# Patient Record
Sex: Female | Born: 1965 | Race: White | Hispanic: No | Marital: Single
Health system: Southern US, Community
[De-identification: ages and names within clinical notes are randomized; demographics above are authoritative.]

---

## 2001-04-12 ENCOUNTER — Encounter: Payer: Self-pay | Admitting: Obstetrics and Gynecology

## 2001-04-12 ENCOUNTER — Inpatient Hospital Stay (HOSPITAL_COMMUNITY): Admission: AD | Admit: 2001-04-12 | Discharge: 2001-04-12 | Payer: Self-pay | Admitting: Obstetrics and Gynecology

## 2001-05-04 ENCOUNTER — Other Ambulatory Visit: Admission: RE | Admit: 2001-05-04 | Discharge: 2001-05-04 | Payer: Self-pay | Admitting: Obstetrics and Gynecology

## 2001-06-14 ENCOUNTER — Encounter: Payer: Self-pay | Admitting: Obstetrics and Gynecology

## 2001-06-14 ENCOUNTER — Ambulatory Visit (HOSPITAL_COMMUNITY): Admission: RE | Admit: 2001-06-14 | Discharge: 2001-06-14 | Payer: Self-pay | Admitting: Obstetrics and Gynecology

## 2001-11-20 ENCOUNTER — Inpatient Hospital Stay (HOSPITAL_COMMUNITY): Admission: AD | Admit: 2001-11-20 | Discharge: 2001-11-20 | Payer: Self-pay | Admitting: Obstetrics and Gynecology

## 2001-11-21 ENCOUNTER — Inpatient Hospital Stay (HOSPITAL_COMMUNITY): Admission: AD | Admit: 2001-11-21 | Discharge: 2001-11-21 | Payer: Self-pay | Admitting: Obstetrics and Gynecology

## 2001-11-22 ENCOUNTER — Inpatient Hospital Stay (HOSPITAL_COMMUNITY): Admission: AD | Admit: 2001-11-22 | Discharge: 2001-11-24 | Payer: Self-pay | Admitting: Obstetrics and Gynecology

## 2001-11-22 ENCOUNTER — Encounter (INDEPENDENT_AMBULATORY_CARE_PROVIDER_SITE_OTHER): Payer: Self-pay

## 2002-01-05 ENCOUNTER — Encounter: Payer: Self-pay | Admitting: Emergency Medicine

## 2002-01-05 ENCOUNTER — Emergency Department (HOSPITAL_COMMUNITY): Admission: EM | Admit: 2002-01-05 | Discharge: 2002-01-05 | Payer: Self-pay | Admitting: Emergency Medicine

## 2009-10-03 ENCOUNTER — Encounter: Admission: RE | Admit: 2009-10-03 | Discharge: 2009-10-03 | Payer: Self-pay | Admitting: Obstetrics and Gynecology
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
## 2010-11-08 NOTE — Op Note (Signed)
[facility]  Patient:    [name], Paige Whitney Visit Number: [id number] MRN: [id number]          Service Type: OBS Location: 9300 [id number] Attending Physician:  Leonard Schwartz Dictated by:   Maris Berger. Pennie Rushing, M.D. Proc. Date: 11/23/01 Admit Date:  11/22/2001 Discharge Date: 11/24/2001                             Operative Report  PREOPERATIVE DIAGNOSES:       Desire for surgical sterilization.  POSTOPERATIVE DIAGNOSES:      Desire for surgical sterilization.  OPERATION:                    Postpartum tubal ligation.  ANESTHESIA:                   Epidural.  ESTIMATED BLOOD LOSS:         Less than 10 cc.  COMPLICATIONS:                None.  FINDINGS:                     The left tube and ovary appeared normal for the gravid state.  The right tube had a small peritubal cyst.  PREOPERATIVE DISCUSSION:      A discussion was held with the patient concerning her indication for her procedure which is her desire for surgical sterilization as well as the risks involved which include, but are not limited to, anesthesia, bleeding, infection, damage to adjacent organs, and a small risk of failure of the tubal sterilization resulting in subsequent pregnancy. She seemed to understand, had her questions answered, and wished to proceed.  PROCEDURE:                    Patient was taken to the operating room after appropriate identification and placed on the operating table.  Her labor epidural was in place and was dosed for surgical anesthesia.  The abdomen was scrubbed with multiple layers of Betadine and draped as a sterile field.  The subumbilical area was infiltrated with 10 cc of 0.25% Marcaine.  A subumbilical incision was made and the peritoneum entered.  The left fallopian tube was identified, followed to its fimbriated end, and grasped at the isthmic portion and elevated.  A suture of 2-0 chromic was placed through the mesosalpinx and tied  fore and aft on the knuckle of tube.  A second ligature was placed proximal to that and the intervening knuckle of tube excised.  The cut ends were cauterized.  A similar procedure was carried out on the opposite side.  Hemostasis was noted to be adequate.  The abdominoperitoneum was closed with a running suture of 0 Vicryl.  The rectus fascia was closed with a running suture of 0 Vicryl.  Subcuticular sutures of 3-0 Vicryl were used to close the skin incision.  Sterile dressing was applied.  Patient was taken from the operating room to the recovery room in satisfactory condition having tolerated the procedure well with sponge and instrument counts correct. Dictated by:   Maris Berger. Pennie Rushing, M.D. Attending Physician:  Leonard Schwartz DD:  11/23/01 TD:  11/25/01 Job: [id number]

## 2010-11-08 NOTE — Discharge Summary (Signed)
[facility]  Patient:    Paige Whitney, MORGENTHALER Visit Number: [id number] MRN: [id number]          Service Type: OBS Location: 9300 [id number] Attending Physician:  Leonard Schwartz Dictated by:   Mack Guise, C.N.M. Admit Date:  11/22/2001 Discharge Date: 11/24/2001                             Discharge Summary  ADMITTING DIAGNOSES:             1. Intrauterine pregnancy at term.                                  2. Early active labor.                                  3. Desires sterilization.  PROCEDURES:                      1. Normal spontaneous vaginal delivery.                                  2. Repair of right labial and first-degree                                     vaginal laceration.                                  3. Postpartum bilateral tubal sterilization.  DISCHARGE DIAGNOSES:             1. Intrauterine pregnancy at term.                                  2. Early active labor.                                  3. Desires sterilization.                                  4. Normal spontaneous vaginal delivery.                                  5. Repair of right labial and first-degree                                     vaginal laceration.                                  6. Postpartum bilateral tubal sterilization.  HISTORY AND HOSPITAL COURSE:     The patient is a 45 year old gravida 4, para 2-0-1-2 who presented at term in early active labor.  Her labor progressed normally and she went  on to a normal spontaneous vaginal delivery with the birth of an 8-pound 11-ounce female infant name Mason with Apgar scores of 8 at one minute, 9 at five minutes.  The patient has done well in the immediate postpartum period.  Her hemoglobin on the first postpartum day was 11.0.  She underwent postpartum bilateral tubal sterilization by Dr. Dierdre Forth and on this her second postpartum day/first postop day she is judged to be in satisfactory  condition for discharge.  DISCHARGE INSTRUCTIONS:          Per [facility] handout.  DISCHARGE MEDICATIONS:           1. Motrin 600 mg p.o. q.6h. p.r.n. pain.                                  2. Tylox one to two p.o. q.3-4h. pain.                                  3. Prenatal vitamins. Dictated by:   Mack Guise, C.N.M. Attending Physician:  Leonard Schwartz DD:  11/24/01 TD:  11/25/01 Job: [phone]

## 2016-09-11 ENCOUNTER — Ambulatory Visit (HOSPITAL_COMMUNITY)
Admission: EM | Admit: 2016-09-11 | Discharge: 2016-09-11 | Disposition: A | Discharge status: Home / Self Care | Payer: Self-pay | Attending: Family Medicine | Admitting: Family Medicine

## 2016-09-11 ENCOUNTER — Encounter (HOSPITAL_COMMUNITY): Payer: Self-pay | Admitting: Emergency Medicine

## 2016-09-11 DIAGNOSIS — H6123 Impacted cerumen, bilateral: Secondary | ICD-10-CM

## 2016-09-11 NOTE — ED Notes (Signed)
PT does not wish to see a provider for this visit. Large amount of wax removed bilaterally. Ear drums visible with no redness. PT has no questions or requests at discharge.

## 2016-09-11 NOTE — ED Triage Notes (Signed)
Both ears are clogged with wax.

## 2019-05-28 ENCOUNTER — Inpatient Hospital Stay (HOSPITAL_COMMUNITY): Payer: Medicaid Other

## 2019-05-28 ENCOUNTER — Emergency Department (HOSPITAL_COMMUNITY): Payer: Medicaid Other

## 2019-05-28 ENCOUNTER — Other Ambulatory Visit: Payer: Self-pay

## 2019-05-28 ENCOUNTER — Inpatient Hospital Stay (HOSPITAL_COMMUNITY)
Admission: EM | Admit: 2019-05-28 | Discharge: 2019-05-30 | DRG: 917 | Payer: Medicaid Other | Attending: Pulmonary Disease | Admitting: Pulmonary Disease

## 2019-05-28 ENCOUNTER — Encounter (HOSPITAL_COMMUNITY): Payer: Self-pay | Admitting: Radiology

## 2019-05-28 ENCOUNTER — Other Ambulatory Visit (HOSPITAL_COMMUNITY): Payer: Medicaid Other

## 2019-05-28 DIAGNOSIS — F1990 Other psychoactive substance use, unspecified, uncomplicated: Secondary | ICD-10-CM

## 2019-05-28 DIAGNOSIS — R739 Hyperglycemia, unspecified: Secondary | ICD-10-CM | POA: Diagnosis present

## 2019-05-28 DIAGNOSIS — I609 Nontraumatic subarachnoid hemorrhage, unspecified: Secondary | ICD-10-CM

## 2019-05-28 DIAGNOSIS — E872 Acidosis, unspecified: Secondary | ICD-10-CM | POA: Diagnosis present

## 2019-05-28 DIAGNOSIS — Z5289 Donor of other specified organs or tissues: Secondary | ICD-10-CM

## 2019-05-28 DIAGNOSIS — I468 Cardiac arrest due to other underlying condition: Secondary | ICD-10-CM | POA: Diagnosis present

## 2019-05-28 DIAGNOSIS — G9382 Brain death: Secondary | ICD-10-CM | POA: Diagnosis present

## 2019-05-28 DIAGNOSIS — R402212 Coma scale, best verbal response, none, at arrival to emergency department: Secondary | ICD-10-CM | POA: Diagnosis present

## 2019-05-28 DIAGNOSIS — T405X1A Poisoning by cocaine, accidental (unintentional), initial encounter: Secondary | ICD-10-CM | POA: Diagnosis present

## 2019-05-28 DIAGNOSIS — F1410 Cocaine abuse, uncomplicated: Secondary | ICD-10-CM | POA: Diagnosis present

## 2019-05-28 DIAGNOSIS — I959 Hypotension, unspecified: Secondary | ICD-10-CM | POA: Diagnosis present

## 2019-05-28 DIAGNOSIS — I469 Cardiac arrest, cause unspecified: Secondary | ICD-10-CM | POA: Diagnosis present

## 2019-05-28 DIAGNOSIS — T41291A Poisoning by other general anesthetics, accidental (unintentional), initial encounter: Secondary | ICD-10-CM | POA: Diagnosis present

## 2019-05-28 DIAGNOSIS — R402312 Coma scale, best motor response, none, at arrival to emergency department: Secondary | ICD-10-CM | POA: Diagnosis present

## 2019-05-28 DIAGNOSIS — Z789 Other specified health status: Secondary | ICD-10-CM

## 2019-05-28 DIAGNOSIS — R579 Shock, unspecified: Secondary | ICD-10-CM | POA: Diagnosis present

## 2019-05-28 DIAGNOSIS — R402112 Coma scale, eyes open, never, at arrival to emergency department: Secondary | ICD-10-CM | POA: Diagnosis present

## 2019-05-28 DIAGNOSIS — R4020 Unspecified coma: Secondary | ICD-10-CM | POA: Diagnosis present

## 2019-05-28 DIAGNOSIS — J9600 Acute respiratory failure, unspecified whether with hypoxia or hypercapnia: Secondary | ICD-10-CM | POA: Diagnosis present

## 2019-05-28 DIAGNOSIS — Z20828 Contact with and (suspected) exposure to other viral communicable diseases: Secondary | ICD-10-CM | POA: Diagnosis present

## 2019-05-28 DIAGNOSIS — R402432 Glasgow coma scale score 3-8, at arrival to emergency department: Secondary | ICD-10-CM

## 2019-05-28 DIAGNOSIS — Z005 Encounter for examination of potential donor of organ and tissue: Secondary | ICD-10-CM

## 2019-05-28 LAB — CBG MONITORING, ED: Glucose-Capillary: 285 mg/dL — ABNORMAL HIGH (ref 70–99)

## 2019-05-28 LAB — I-STAT CHEM 8, ED
BUN: 11 mg/dL (ref 6–20)
Calcium, Ion: 1.04 mmol/L — ABNORMAL LOW (ref 1.15–1.40)
Chloride: 105 mmol/L (ref 98–111)
Creatinine, Ser: 0.7 mg/dL (ref 0.44–1.00)
Glucose, Bld: 283 mg/dL — ABNORMAL HIGH (ref 70–99)
HCT: 35 % — ABNORMAL LOW (ref 36.0–46.0)
Hemoglobin: 11.9 g/dL — ABNORMAL LOW (ref 12.0–15.0)
Potassium: 3.5 mmol/L (ref 3.5–5.1)
Sodium: 139 mmol/L (ref 135–145)
TCO2: 19 mmol/L — ABNORMAL LOW (ref 22–32)

## 2019-05-28 LAB — CBC WITH DIFFERENTIAL/PLATELET
Abs Immature Granulocytes: 0.38 10*3/uL — ABNORMAL HIGH (ref 0.00–0.07)
Basophils Absolute: 0.1 10*3/uL (ref 0.0–0.1)
Basophils Relative: 1 %
Eosinophils Absolute: 0 10*3/uL (ref 0.0–0.5)
Eosinophils Relative: 0 %
HCT: 43.2 % (ref 36.0–46.0)
Hemoglobin: 13.1 g/dL (ref 12.0–15.0)
Immature Granulocytes: 2 %
Lymphocytes Relative: 15 %
Lymphs Abs: 2.5 10*3/uL (ref 0.7–4.0)
MCH: 28.8 pg (ref 26.0–34.0)
MCHC: 30.3 g/dL (ref 30.0–36.0)
MCV: 94.9 fL (ref 80.0–100.0)
Monocytes Absolute: 0.8 10*3/uL (ref 0.1–1.0)
Monocytes Relative: 5 %
Neutro Abs: 13.1 10*3/uL — ABNORMAL HIGH (ref 1.7–7.7)
Neutrophils Relative %: 77 %
Platelets: 250 10*3/uL (ref 150–400)
RBC: 4.55 MIL/uL (ref 3.87–5.11)
RDW: 17.2 % — ABNORMAL HIGH (ref 11.5–15.5)
WBC: 17 10*3/uL — ABNORMAL HIGH (ref 4.0–10.5)
nRBC: 0.2 % (ref 0.0–0.2)

## 2019-05-28 LAB — COMPREHENSIVE METABOLIC PANEL
ALT: 40 U/L (ref 0–44)
AST: 42 U/L — ABNORMAL HIGH (ref 15–41)
Albumin: 3.8 g/dL (ref 3.5–5.0)
Alkaline Phosphatase: 67 U/L (ref 38–126)
Anion gap: 20 — ABNORMAL HIGH (ref 5–15)
BUN: 12 mg/dL (ref 6–20)
CO2: 18 mmol/L — ABNORMAL LOW (ref 22–32)
Calcium: 8.9 mg/dL (ref 8.9–10.3)
Chloride: 101 mmol/L (ref 98–111)
Creatinine, Ser: 0.95 mg/dL (ref 0.44–1.00)
GFR calc Af Amer: >60 mL/min (ref >60)
GFR calc non Af Amer: >60 mL/min (ref >60)
Glucose, Bld: 363 mg/dL — ABNORMAL HIGH (ref 70–99)
Potassium: 3.4 mmol/L — ABNORMAL LOW (ref 3.5–5.1)
Sodium: 139 mmol/L (ref 135–145)
Total Bilirubin: 0.4 mg/dL (ref 0.3–1.2)
Total Protein: 6.7 g/dL (ref 6.5–8.1)

## 2019-05-28 LAB — I-STAT BETA HCG BLOOD, ED (MC, WL, AP ONLY): I-stat hCG, quantitative: <5 m[IU]/mL (ref <5)

## 2019-05-28 LAB — POCT I-STAT 7, (LYTES, BLD GAS, ICA,H+H)
Acid-base deficit: 5 mmol/L — ABNORMAL HIGH (ref 0.0–2.0)
Bicarbonate: 16.2 mmol/L — ABNORMAL LOW (ref 20.0–28.0)
Calcium, Ion: 1.18 mmol/L (ref 1.15–1.40)
HCT: 38 % (ref 36.0–46.0)
Hemoglobin: 12.9 g/dL (ref 12.0–15.0)
O2 Saturation: 100 %
Patient temperature: 32.9
Potassium: 3.8 mmol/L (ref 3.5–5.1)
Sodium: 145 mmol/L (ref 135–145)
TCO2: 17 mmol/L — ABNORMAL LOW (ref 22–32)
pCO2 arterial: 17 mmHg — CL (ref 32.0–48.0)
pH, Arterial: 7.573 — ABNORMAL HIGH (ref 7.350–7.450)
pO2, Arterial: 168 mmHg — ABNORMAL HIGH (ref 83.0–108.0)

## 2019-05-28 LAB — ACETAMINOPHEN LEVEL: Acetaminophen (Tylenol), Serum: <10 ug/mL — ABNORMAL LOW (ref 10–30)

## 2019-05-28 LAB — BLOOD GAS, ARTERIAL
Acid-base deficit: 10.5 mmol/L — ABNORMAL HIGH (ref 0.0–2.0)
Acid-base deficit: 4.4 mmol/L — ABNORMAL HIGH (ref 0.0–2.0)
Bicarbonate: 15.5 mmol/L — ABNORMAL LOW (ref 20.0–28.0)
Bicarbonate: 18.1 mmol/L — ABNORMAL LOW (ref 20.0–28.0)
Drawn by: 331471
Drawn by: 44007
FIO2: 100
FIO2: 40
MECHVT: 510 mL
O2 Saturation: 99.2 %
O2 Saturation: 97.9 %
PEEP: 5 cmH2O
Patient temperature: 93.7
Patient temperature: 37
RATE: 15 resp/min
pCO2 arterial: 31.6 mmHg — ABNORMAL LOW (ref 32.0–48.0)
pCO2 arterial: 22.3 mmHg — ABNORMAL LOW (ref 32.0–48.0)
pH, Arterial: 7.292 — ABNORMAL LOW (ref 7.350–7.450)
pH, Arterial: 7.52 — ABNORMAL HIGH (ref 7.350–7.450)
pO2, Arterial: 416 mmHg — ABNORMAL HIGH (ref 83.0–108.0)
pO2, Arterial: 204 mmHg — ABNORMAL HIGH (ref 83.0–108.0)

## 2019-05-28 LAB — ETHANOL: Alcohol, Ethyl (B): <10 mg/dL (ref <10)

## 2019-05-28 LAB — RAPID URINE DRUG SCREEN, HOSP PERFORMED
Amphetamines: NOT DETECTED
Barbiturates: NOT DETECTED
Benzodiazepines: NOT DETECTED
Cocaine: POSITIVE — AB
Opiates: NOT DETECTED
Tetrahydrocannabinol: POSITIVE — AB

## 2019-05-28 LAB — GLUCOSE, CAPILLARY: Glucose-Capillary: 163 mg/dL — ABNORMAL HIGH (ref 70–99)

## 2019-05-28 LAB — MRSA PCR SCREENING: MRSA by PCR: NEGATIVE

## 2019-05-28 LAB — URINALYSIS, ROUTINE W REFLEX MICROSCOPIC
Bacteria, UA: NONE SEEN
Bilirubin Urine: NEGATIVE
Glucose, UA: >=500 mg/dL — AB
Ketones, ur: 5 mg/dL — AB
Leukocytes,Ua: NEGATIVE
Nitrite: NEGATIVE
Protein, ur: 100 mg/dL — AB
Specific Gravity, Urine: 1.007 (ref 1.005–1.030)
pH: 7 (ref 5.0–8.0)

## 2019-05-28 LAB — SARS CORONAVIRUS 2 BY RT PCR (HOSPITAL ORDER, PERFORMED IN ~~LOC~~ HOSPITAL LAB): SARS Coronavirus 2: NEGATIVE

## 2019-05-28 LAB — PROTIME-INR
INR: 1.2 (ref 0.8–1.2)
Prothrombin Time: 15 seconds (ref 11.4–15.2)

## 2019-05-28 LAB — PROCALCITONIN: Procalcitonin: 0.4 ng/mL

## 2019-05-28 LAB — LACTIC ACID, PLASMA: Lactic Acid, Venous: 6.9 mmol/L (ref 0.5–1.9)

## 2019-05-28 LAB — SALICYLATE LEVEL: Salicylate Lvl: <7 mg/dL (ref 2.8–30.0)

## 2019-05-28 LAB — APTT: aPTT: 42 seconds — ABNORMAL HIGH (ref 24–36)

## 2019-05-28 LAB — HIV ANTIBODY (ROUTINE TESTING W REFLEX): HIV Screen 4th Generation wRfx: NONREACTIVE

## 2019-05-28 MED ORDER — LACTATED RINGERS IV BOLUS (SEPSIS)
1000.0000 mL | Freq: Once | INTRAVENOUS | Status: AC
  Administered 2019-05-28: 1000 mL via INTRAVENOUS

## 2019-05-28 MED ORDER — SODIUM CHLORIDE 0.9 % IV SOLN
3.0000 g | Freq: Four times a day (QID) | INTRAVENOUS | Status: DC
  Administered 2019-05-28: 3 g via INTRAVENOUS
  Filled 2019-05-28 (×4): qty 8

## 2019-05-28 MED ORDER — CHLORHEXIDINE GLUCONATE 0.12% ORAL RINSE (MEDLINE KIT)
15.0000 mL | Freq: Two times a day (BID) | OROMUCOSAL | Status: DC
  Administered 2019-05-28 – 2019-05-30 (×4): 15 mL via OROMUCOSAL

## 2019-05-28 MED ORDER — LACTATED RINGERS IV SOLN
Freq: Once | INTRAVENOUS | Status: AC
  Administered 2019-05-29: via INTRAVENOUS

## 2019-05-28 MED ORDER — CHLORHEXIDINE GLUCONATE CLOTH 2 % EX PADS
6.0000 | MEDICATED_PAD | Freq: Every day | CUTANEOUS | Status: DC
  Administered 2019-05-30: 6 via TOPICAL

## 2019-05-28 MED ORDER — NALOXONE HCL 2 MG/2ML IJ SOSY
PREFILLED_SYRINGE | INTRAMUSCULAR | Status: AC
  Administered 2019-05-28: 2 mg
  Filled 2019-05-28: qty 2

## 2019-05-28 MED ORDER — ORAL CARE MOUTH RINSE: 15.0000 mL | OROMUCOSAL | Status: DC

## 2019-05-28 MED ORDER — CHLORHEXIDINE GLUCONATE 0.12% ORAL RINSE (MEDLINE KIT): 15.0000 mL | Freq: Two times a day (BID) | OROMUCOSAL | Status: DC

## 2019-05-28 MED ORDER — PANTOPRAZOLE SODIUM 40 MG IV SOLR: 40.0000 mg | Freq: Every day | INTRAVENOUS | Status: DC

## 2019-05-28 MED ORDER — MIDAZOLAM HCL 2 MG/2ML IJ SOLN: 2.0000 mg | INTRAMUSCULAR | Status: DC | PRN

## 2019-05-28 MED ORDER — INSULIN ASPART 100 UNIT/ML ~~LOC~~ SOLN: 2.0000 [IU] | SUBCUTANEOUS | Status: DC

## 2019-05-28 MED ORDER — IOHEXOL 350 MG/ML SOLN
100.0000 mL | Freq: Once | INTRAVENOUS | Status: AC | PRN
  Administered 2019-05-28: 100 mL via INTRAVENOUS

## 2019-05-28 MED ORDER — EPINEPHRINE 1 MG/10ML IJ SOSY
PREFILLED_SYRINGE | INTRAMUSCULAR | Status: AC | PRN
  Administered 2019-05-28 (×2): 1 mg via INTRAVENOUS

## 2019-05-28 MED ORDER — INSULIN ASPART 100 UNIT/ML ~~LOC~~ SOLN
2.0000 [IU] | SUBCUTANEOUS | Status: DC
  Administered 2019-05-28: 4 [IU] via SUBCUTANEOUS

## 2019-05-28 MED ORDER — FENTANYL CITRATE (PF) 100 MCG/2ML IJ SOLN: 50.0000 ug | INTRAMUSCULAR | Status: DC | PRN

## 2019-05-28 MED ORDER — VANCOMYCIN HCL IN DEXTROSE 1-5 GM/200ML-% IV SOLN
1000.0000 mg | INTRAVENOUS | Status: AC
  Administered 2019-05-29: 1000 mg via INTRAVENOUS
  Filled 2019-05-28: qty 200

## 2019-05-28 MED ORDER — NOREPINEPHRINE 4 MG/250ML-% IV SOLN
0.0000 ug/min | INTRAVENOUS | Status: DC
  Administered 2019-05-28: 8 ug/min via INTRAVENOUS
  Administered 2019-05-28: 2 ug/min via INTRAVENOUS
  Administered 2019-05-29: 18 ug/min via INTRAVENOUS
  Filled 2019-05-28 (×4): qty 250

## 2019-05-28 MED ORDER — TECHNETIUM TC 99M EXAMETAZIME IV KIT
18.5000 | PACK | Freq: Once | INTRAVENOUS | Status: AC | PRN
  Administered 2019-05-28: 18.5 via INTRAVENOUS

## 2019-05-28 MED ORDER — ORAL CARE MOUTH RINSE
15.0000 mL | OROMUCOSAL | Status: DC
  Administered 2019-05-28 (×3): 15 mL via OROMUCOSAL

## 2019-05-28 MED ORDER — SODIUM CHLORIDE 0.9 % IV SOLN
1000.0000 mg | INTRAVENOUS | Status: AC
  Administered 2019-05-29: 1000 mg via INTRAVENOUS
  Filled 2019-05-28: qty 8

## 2019-05-28 MED ORDER — SODIUM CHLORIDE 0.9 % IV SOLN
INTRAVENOUS | Status: DC
  Administered 2019-05-28: 15:00:00 via INTRAVENOUS

## 2019-05-28 MED ORDER — METRONIDAZOLE IN NACL 5-0.79 MG/ML-% IV SOLN
500.0000 mg | Freq: Once | INTRAVENOUS | Status: DC
  Filled 2019-05-28: qty 100

## 2019-05-28 MED ORDER — LACTATED RINGERS IV SOLN
INTRAVENOUS | Status: DC
  Administered 2019-05-29 – 2019-05-30 (×2): via INTRAVENOUS

## 2019-05-28 MED ORDER — VANCOMYCIN HCL IN DEXTROSE 1-5 GM/200ML-% IV SOLN: 1000.0000 mg | Freq: Once | INTRAVENOUS | Status: DC

## 2019-05-28 MED ORDER — CIPROFLOXACIN IN D5W 400 MG/200ML IV SOLN
400.0000 mg | INTRAVENOUS | Status: AC
  Administered 2019-05-29: 400 mg via INTRAVENOUS
  Filled 2019-05-28: qty 200

## 2019-05-28 MED ORDER — SODIUM CHLORIDE (PF) 0.9 % IJ SOLN
INTRAMUSCULAR | Status: AC
  Administered 2019-05-28: 12:00:00
  Filled 2019-05-28: qty 50

## 2019-05-28 MED ORDER — VANCOMYCIN HCL 10 G IV SOLR
1250.0000 mg | Freq: Once | INTRAVENOUS | Status: AC
  Administered 2019-05-28: 1250 mg via INTRAVENOUS
  Filled 2019-05-28: qty 1250

## 2019-05-28 MED ORDER — SODIUM CHLORIDE 0.9 % IV SOLN
2.0000 g | Freq: Once | INTRAVENOUS | Status: AC
  Administered 2019-05-28: 2 g via INTRAVENOUS
  Filled 2019-05-28: qty 2

## 2019-05-28 NOTE — Code Documentation (Addendum)
2 Narcan in
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
## 2019-05-28 NOTE — Code Documentation (Signed)
Compressions continued

## 2019-05-28 NOTE — ED Provider Notes (Signed)
[facility] DEPT Provider Note   CSN: [id number] Arrival date & time: 05/28/19  0818     History   Chief Complaint Chief Complaint  Patient presents with   Unresponsive    HPI Paige Whitney is a 53 y.o. female.     HPI   53 year old female with no significant medical history arrives to the emergency department with chief complaint of being unresponsive, found to be in PEA arrest on arrival.  Boyfriend, Marjory Lies, admits they had been using ketamine and cocaine throughout the night, and that she appeared to be walking stumbling, and speaking in gibberish, but it did not appear to be abnormal for them using ketamine just prior.  Reports that she then laid down and appeared to take 1 breath, and then appeared to have stopped breathing.  He immediately picked her up and put her in the car and drove her to the emergency department.  He estimates it was approximately 5-minute drive in the car, and that it was likely 5 minutes of lifting during getting her into the vehicle.  On arrival to the emergency department, she is unresponsive, apneic, and pulseless.  He denies any known medical problems, or other medical illness leading up to this event.  They have been dating since February.  He denies her appearing to have any focal numbness or weakness prior to becoming unresponsive, but does report that she was walking with an unsteady gait.  She is still legally married to her husband Rob and father of her children, although they have been separated for the last 10 years. They have a very good relationship and he is still her POA.    No past medical history on file.  Patient Active Problem List   Diagnosis Date Noted   Acute respiratory failure ([place]) 05/28/2019   Cardiopulmonary arrest ([place]) 05/28/2019   SAH (subarachnoid hemorrhage) ([place]) 05/28/2019   Coma ([place]) 05/28/2019   Shock ([place]) 05/28/2019   Lactic acidosis 05/28/2019    No past surgical history  on file.   OB History   No obstetric history on file.      Home Medications    Prior to Admission medications   Not on File    Family History No family history on file.  Social History Social History   Tobacco Use   Smoking status: Not on file  Substance Use Topics   Alcohol use: Not on file   Drug use: Not on file     Allergies   Erythromycin   Review of Systems Review of Systems  Unable to perform ROS: Patient unresponsive     Physical Exam Updated Vital Signs BP (!) 87/64    Pulse 76    Temp (!) 95.7 F (35.4 C)    Resp 20    Ht '5\' 8"'  (1.727 m)    Wt 56.3 kg    SpO2 100%    BMI 18.87 kg/m   Physical Exam Vitals signs and nursing note reviewed.  Constitutional:      General: She is not in acute distress.    Appearance: She is normal weight. She is ill-appearing and toxic-appearing. She is not diaphoretic.     Comments: Cyanotic, limp, unresponsive  HENT:     Head: Normocephalic and atraumatic.  Eyes:     Comments: Pupils 9m unresponsive  Cardiovascular:     Comments: Pulseless on arrival Pulmonary:     Comments: Apneic on arrival Chest rise BVM Abdominal:     General: There  is no distension.     Palpations: Abdomen is soft.  Musculoskeletal:        General: No tenderness.  Skin:    General: Skin is warm and dry.     Findings: No erythema or rash.  Neurological:     Mental Status: She is unresponsive.     GCS: GCS eye subscore is 1. GCS verbal subscore is 1. GCS motor subscore is 1.      ED Treatments / Results  Labs (all labs ordered are listed, but only abnormal results are displayed) Labs Reviewed  LACTIC ACID, PLASMA - Abnormal; Notable for the following components:      Result Value   Lactic Acid, Venous 6.9 (*)    All other components within normal limits  COMPREHENSIVE METABOLIC PANEL - Abnormal; Notable for the following components:   Potassium 3.4 (*)    CO2 18 (*)    Glucose, Bld 363 (*)    AST 42 (*)    Anion gap  20 (*)    All other components within normal limits  CBC WITH DIFFERENTIAL/PLATELET - Abnormal; Notable for the following components:   WBC 17.0 (*)    RDW 17.2 (*)    Neutro Abs 13.1 (*)    Abs Immature Granulocytes 0.38 (*)    All other components within normal limits  APTT - Abnormal; Notable for the following components:   aPTT 42 (*)    All other components within normal limits  URINALYSIS, ROUTINE W REFLEX MICROSCOPIC - Abnormal; Notable for the following components:   Color, Urine STRAW (*)    Glucose, UA >=500 (*)    Hgb urine dipstick SMALL (*)    Ketones, ur 5 (*)    Protein, ur 100 (*)    All other components within normal limits  RAPID URINE DRUG SCREEN, HOSP PERFORMED - Abnormal; Notable for the following components:   Cocaine POSITIVE (*)    Tetrahydrocannabinol POSITIVE (*)    All other components within normal limits  ACETAMINOPHEN LEVEL - Abnormal; Notable for the following components:   Acetaminophen (Tylenol), Serum <10 (*)    All other components within normal limits  BLOOD GAS, ARTERIAL - Abnormal; Notable for the following components:   pH, Arterial 7.292 (*)    pCO2 arterial 31.6 (*)    pO2, Arterial 416 (*)    Bicarbonate 15.5 (*)    Acid-base deficit 10.5 (*)    All other components within normal limits  GLUCOSE, CAPILLARY - Abnormal; Notable for the following components:   Glucose-Capillary 163 (*)    All other components within normal limits  CBG MONITORING, ED - Abnormal; Notable for the following components:   Glucose-Capillary 285 (*)    All other components within normal limits  I-STAT CHEM 8, ED - Abnormal; Notable for the following components:   Glucose, Bld 283 (*)    Calcium, Ion 1.04 (*)    TCO2 19 (*)    Hemoglobin 11.9 (*)    HCT 35.0 (*)    All other components within normal limits  POCT I-STAT 7, (LYTES, BLD GAS, ICA,H+H) - Abnormal; Notable for the following components:   pH, Arterial 7.573 (*)    pCO2 arterial 17.0 (*)    pO2,  Arterial 168.0 (*)    Bicarbonate 16.2 (*)    TCO2 17 (*)    Acid-base deficit 5.0 (*)    All other components within normal limits  SARS CORONAVIRUS 2 BY RT PCR (HOSPITAL ORDER, [facility])  CULTURE, RESPIRATORY  MRSA PCR SCREENING  CULTURE, BLOOD (ROUTINE X 2)  CULTURE, BLOOD (ROUTINE X 2)  URINE CULTURE  PROTIME-INR  ETHANOL  SALICYLATE LEVEL  HIV ANTIBODY (ROUTINE TESTING W REFLEX)  PROCALCITONIN  LACTIC ACID, PLASMA  BLOOD GAS, ARTERIAL  CBC  BASIC METABOLIC PANEL  BLOOD GAS, ARTERIAL  MAGNESIUM  I-STAT BETA HCG BLOOD, ED ([facility], [facility], [facility] ONLY)    EKG None  Radiology Ct Angio Head W Or Wo Contrast  Result Date: 05/28/2019 CLINICAL DATA:  Unresponsive. Subarachnoid hemorrhage shown by previous CT. EXAM: CT ANGIOGRAPHY HEAD AND NECK TECHNIQUE: Multidetector CT imaging of the head and neck was performed using the standard protocol during bolus administration of intravenous contrast. Multiplanar CT image reconstructions and MIPs were obtained to evaluate the vascular anatomy. Carotid stenosis measurements (when applicable) are obtained utilizing NASCET criteria, using the distal internal carotid diameter as the denominator. CONTRAST:  164m OMNIPAQUE IOHEXOL 350 MG/ML SOLN COMPARISON:  Head CT earlier same day FINDINGS: CTA NECK FINDINGS Aortic arch: Normal Right carotid system: Common carotid artery widely patent to the bifurcation. Carotid bifurcation is normal without atherosclerotic disease. Cervical ICA is normal. Left carotid system: Common carotid artery widely patent to the bifurcation. Carotid bifurcation is normal without atherosclerotic disease. Cervical ICA is normal. Vertebral arteries: Both vertebral arteries widely patent with the left being dominant. Skeleton: Ordinary cervical spondylosis. Other neck: No mass or lymphadenopathy. Upper chest: Patient intubated. Mild pleural and parenchymal scarring at the lung apices. Review of the MIP images  confirms the above findings CTA HEAD FINDINGS Anterior circulation: Both internal carotid arteries are patent at the skull base level but there is no visible intracranial flow. Flow is present in the external branches and therefore this is most concerning for brain death, though that cannot be established by this technique. Posterior circulation: Similarly, no flow can be seen in the vertebrobasilar system. Venous sinuses: No significant venous sinus opacification. Anatomic variants: None Review of the MIP images confirms the above findings IMPRESSION: Intracranial flow cannot be demonstrated, suggesting the possibility of brain death. This cannot be established using this technique. However, I am concerned about the possibility. External carotid branches do show flow and we should be seeing intracranial flow in the anterior and posterior circulation major vessels. Electronically Signed   By: MNelson ChimesM.D.   On: 05/28/2019 11:18   Ct Head Wo Contrast  Result Date: 05/28/2019 CLINICAL DATA:  Unresponsive, hypotensive, suspected drug overdose. EXAM: CT HEAD WITHOUT CONTRAST CT CERVICAL SPINE WITHOUT CONTRAST TECHNIQUE: Multidetector CT imaging of the head and cervical spine was performed following the standard protocol without intravenous contrast. Multiplanar CT image reconstructions of the cervical spine were also generated. COMPARISON:  None. FINDINGS: CT HEAD FINDINGS Brain: Diffuse subarachnoid hemorrhage fills basilar cisterns and sulci. Signs of intraventricular blood as well in the lateral ventricle on the left. Basilar cisterns are no longer visible. Ventricles remain visible. No midline shift. No visible intraparenchymal lesion. Vascular: Vascular structures not visible. Pattern of hemorrhage suggests ruptured intracranial aneurysm. There is more pronounced focal clot anterior to the sella and along the anterior interhemispheric fissure. Skull: Normal. Negative for fracture or focal lesion.  Sinuses/Orbits: No acute finding. Other: None. CT CERVICAL SPINE FINDINGS Alignment: Mild straightening of normal cervical lordosis likely positional. Skull base and vertebrae: No acute fracture. No primary bone lesion or focal pathologic process. Soft tissues and spinal canal: Assessment of prevertebral soft tissues limited by endotracheal tube and Derek to. Disc  levels: Multilevel spinal degenerative change with facet arthropathy at C3-4, C4-5 and C5-6. Minimal anterolisthesis of C4 on C5. Most pronounced disc space narrowing and uncovertebral degenerative changes are noted at the C5-6 level. Upper chest: Mild increased interstitial thickening at the lung apices. Other: None IMPRESSION: 1. Diffuse subarachnoid hemorrhage as described with signs of intraventricular blood as well. 2. Pattern of hemorrhage suggests ruptured intracranial aneurysm. 3. No evidence for acute traumatic injury to the cervical spine. 4. Multilevel spinal degenerative change and facet arthropathy. 5. Increased interstitial thickening at the lung apices may represent pneumonitis, scarring or edema. 6. Critical Value/emergent results were called by telephone at the time of interpretation on 05/28/2019 at 10:05 am to [place] , who verbally acknowledged these results. Electronically Signed   By: Zetta Bills M.D.   On: 05/28/2019 10:16   Ct Angio Neck W And/or Wo Contrast  Result Date: 05/28/2019 CLINICAL DATA:  Unresponsive. Subarachnoid hemorrhage shown by previous CT. EXAM: CT ANGIOGRAPHY HEAD AND NECK TECHNIQUE: Multidetector CT imaging of the head and neck was performed using the standard protocol during bolus administration of intravenous contrast. Multiplanar CT image reconstructions and MIPs were obtained to evaluate the vascular anatomy. Carotid stenosis measurements (when applicable) are obtained utilizing NASCET criteria, using the distal internal carotid diameter as the denominator. CONTRAST:  165m OMNIPAQUE  IOHEXOL 350 MG/ML SOLN COMPARISON:  Head CT earlier same day FINDINGS: CTA NECK FINDINGS Aortic arch: Normal Right carotid system: Common carotid artery widely patent to the bifurcation. Carotid bifurcation is normal without atherosclerotic disease. Cervical ICA is normal. Left carotid system: Common carotid artery widely patent to the bifurcation. Carotid bifurcation is normal without atherosclerotic disease. Cervical ICA is normal. Vertebral arteries: Both vertebral arteries widely patent with the left being dominant. Skeleton: Ordinary cervical spondylosis. Other neck: No mass or lymphadenopathy. Upper chest: Patient intubated. Mild pleural and parenchymal scarring at the lung apices. Review of the MIP images confirms the above findings CTA HEAD FINDINGS Anterior circulation: Both internal carotid arteries are patent at the skull base level but there is no visible intracranial flow. Flow is present in the external branches and therefore this is most concerning for brain death, though that cannot be established by this technique. Posterior circulation: Similarly, no flow can be seen in the vertebrobasilar system. Venous sinuses: No significant venous sinus opacification. Anatomic variants: None Review of the MIP images confirms the above findings IMPRESSION: Intracranial flow cannot be demonstrated, suggesting the possibility of brain death. This cannot be established using this technique. However, I am concerned about the possibility. External carotid branches do show flow and we should be seeing intracranial flow in the anterior and posterior circulation major vessels. Electronically Signed   By: MNelson ChimesM.D.   On: 05/28/2019 11:18   Ct Cervical Spine Wo Contrast  Result Date: 05/28/2019 CLINICAL DATA:  Unresponsive, hypotensive, suspected drug overdose. EXAM: CT HEAD WITHOUT CONTRAST CT CERVICAL SPINE WITHOUT CONTRAST TECHNIQUE: Multidetector CT imaging of the head and cervical spine was performed  following the standard protocol without intravenous contrast. Multiplanar CT image reconstructions of the cervical spine were also generated. COMPARISON:  None. FINDINGS: CT HEAD FINDINGS Brain: Diffuse subarachnoid hemorrhage fills basilar cisterns and sulci. Signs of intraventricular blood as well in the lateral ventricle on the left. Basilar cisterns are no longer visible. Ventricles remain visible. No midline shift. No visible intraparenchymal lesion. Vascular: Vascular structures not visible. Pattern of hemorrhage suggests ruptured intracranial aneurysm. There is more pronounced focal clot anterior to the  sella and along the anterior interhemispheric fissure. Skull: Normal. Negative for fracture or focal lesion. Sinuses/Orbits: No acute finding. Other: None. CT CERVICAL SPINE FINDINGS Alignment: Mild straightening of normal cervical lordosis likely positional. Skull base and vertebrae: No acute fracture. No primary bone lesion or focal pathologic process. Soft tissues and spinal canal: Assessment of prevertebral soft tissues limited by endotracheal tube and Derek to. Disc levels: Multilevel spinal degenerative change with facet arthropathy at C3-4, C4-5 and C5-6. Minimal anterolisthesis of C4 on C5. Most pronounced disc space narrowing and uncovertebral degenerative changes are noted at the C5-6 level. Upper chest: Mild increased interstitial thickening at the lung apices. Other: None IMPRESSION: 1. Diffuse subarachnoid hemorrhage as described with signs of intraventricular blood as well. 2. Pattern of hemorrhage suggests ruptured intracranial aneurysm. 3. No evidence for acute traumatic injury to the cervical spine. 4. Multilevel spinal degenerative change and facet arthropathy. 5. Increased interstitial thickening at the lung apices may represent pneumonitis, scarring or edema. 6. Critical Value/emergent results were called by telephone at the time of interpretation on 05/28/2019 at 10:05 am to [place] , who verbally acknowledged these results. Electronically Signed   By: Zetta Bills M.D.   On: 05/28/2019 10:16   Nm Brain W Vasc Flow Min 4v  Addendum Date: 05/28/2019   ADDENDUM REPORT: 05/28/2019 18:45 ADDENDUM: Critical Value/emergent results were called by telephone at the time of interpretation on 05/28/2019 at 1837 hours to Dr. Kipp Brood , who verbally acknowledged these results. Electronically Signed   By: Genevie Ann M.D.   On: 05/28/2019 18:45   Result Date: 05/28/2019 CLINICAL DATA:  53 year old female with poor mental status after possible drug overdose. PEA arrest on presentation. Subarachnoid hemorrhage, and absent intracranial artery enhancement on earlier CTA. EXAM: NM BRAIN SCAN WITH FLOW - 4+ VIEW TECHNIQUE: Radionuclide angiogram and static images of the brain were obtained after intravenous injection of radiopharmaceutical. RADIOPHARMACEUTICALS:  18.5 mCi  Tc-76mCeretec COMPARISON:  CTA head and neck 1041 hours today FINDINGS: Absent intracranial radiotracer activity on both dynamic and delayed images. "Hot nose sign", with preserved extracranial vascular radiotracer. IMPRESSION: Positive for brain death. Electronically Signed: By: HGenevie AnnM.D. On: 05/28/2019 18:36   Dg Chest Portable 1 View  Addendum Date: 05/28/2019   ADDENDUM REPORT: 05/28/2019 09:50 ADDENDUM: These results were called by telephone at the time of interpretation on 05/28/2019 at 9:50 am [facility], who verbally acknowledged these results. Electronically Signed   By: GZetta BillsM.D.   On: 05/28/2019 09:50   Result Date: 05/28/2019 CLINICAL DATA:  Tube placement, potential overdose. EXAM: PORTABLE CHEST 1 VIEW COMPARISON:  None FINDINGS: Endotracheal tube in place in the distal trachea approximately 8 mm above the carina, directed towards the right mainstem bronchus. Variety of hardware projects over the chest including pacer defibrillator pads. Gastric tube courses through in off the  field of the radiograph. Cardiomediastinal contours are likely normal accounting for portable technique. Added density over the right left chest suggest breast implants. Suggestion of background interstitial prominence without consolidation or pleural effusion. No acute bone finding. IMPRESSION: 1. Endotracheal tube in place in the distal trachea, approximately 8 mm above the carina, directed towards the right mainstem bronchus. Retraction approximately 2 cm may be helpful for more optimal placement. 2. Gastric tube courses through the field of the radiograph. 3. Subtle increased interstitial markings are nonspecific and density is accentuated behind presumed bilateral breast implants in the mid chest. Findings may represent asymmetric  edema or mild pneumonitis. Electronically Signed: By: Zetta Bills M.D. On: 05/28/2019 09:45    Procedures .Critical Care Performed by: Gareth Morgan, MD Authorized by: Gareth Morgan, MD   Critical care provider statement:    Critical care time (minutes):  90   Critical care was necessary to treat or prevent imminent or life-threatening deterioration of the following conditions:  CNS failure or compromise, cardiac failure and respiratory failure   Critical care was time spent personally by me on the following activities:  Discussions with consultants, evaluation of patient's response to treatment, examination of patient, ordering and performing treatments and interventions, ordering and review of laboratory studies, ordering and review of radiographic studies, pulse oximetry, re-evaluation of patient's condition, obtaining history from patient or surrogate and review of old charts Procedure Name: Intubation Date/Time: 05/28/2019 8:02 PM Performed by: Gareth Morgan, MD Pre-anesthesia Checklist: Patient identified, Patient being monitored, Emergency Drugs available, Timeout performed and Suction available Oxygen Delivery Method: Ambu bag Ventilation: Mask  ventilation without difficulty Laryngoscope Size: Glidescope Grade View: Grade I Tube size: 7.5 mm Number of attempts: 1 Placement Confirmation: ETT inserted through vocal cords under direct vision,  CO2 detector and Breath sounds checked- equal and bilateral      (including critical care time)  Medications Ordered in ED Medications  metroNIDAZOLE (FLAGYL) IVPB 500 mg (has no administration in time range)  norepinephrine (LEVOPHED) 13m in 2520mpremix infusion (8 mcg/min Intravenous Rate/Dose Verify 05/28/19 1900)  pantoprazole (PROTONIX) injection 40 mg (has no administration in time range)  0.9 %  sodium chloride infusion ( Intravenous Stopped 05/28/19 1842)  fentaNYL (SUBLIMAZE) injection 50 mcg (has no administration in time range)  fentaNYL (SUBLIMAZE) injection 50-200 mcg (has no administration in time range)  midazolam (VERSED) injection 2 mg (has no administration in time range)  midazolam (VERSED) injection 2 mg (has no administration in time range)  Ampicillin-Sulbactam (UNASYN) 3 g in sodium chloride 0.9 % 100 mL IVPB ( Intravenous Rate/Dose Verify 05/28/19 1900)  Chlorhexidine Gluconate Cloth 2 % PADS 6 each (has no administration in time range)  chlorhexidine gluconate (MEDLINE KIT) (PERIDEX) 0.12 % solution 15 mL (has no administration in time range)  MEDLINE mouth rinse (15 mLs Mouth Rinse Given 05/28/19 1844)  insulin aspart (novoLOG) injection 2-6 Units (0 Units Subcutaneous Duplicate 1264/3/3259518 EPINEPHrine (ADRENALIN) 1 MG/10ML injection (1 mg Intravenous Given 05/28/19 0824)  lactated ringers bolus 1,000 mL (0 mLs Intravenous Stopped 05/28/19 0959)    And  lactated ringers bolus 1,000 mL (1,000 mLs Intravenous New Bag/Given 05/28/19 0958)  ceFEPIme (MAXIPIME) 2 g in sodium chloride 0.9 % 100 mL IVPB ( Intravenous Stopped 05/28/19 1333)  vancomycin (VANCOCIN) 1,250 mg in sodium chloride 0.9 % 250 mL IVPB (1,250 mg Intravenous New Bag/Given 05/28/19 1018)  naloxone  (NARCAN) 2 MG/2ML injection (2 mg  Given 05/28/19 0915)  sodium chloride (PF) 0.9 % injection (  Given 05/28/19 1226)  iohexol (OMNIPAQUE) 350 MG/ML injection 100 mL (100 mLs Intravenous Contrast Given 05/28/19 1035)  technetium exametazime (TC-CERETEC) injection 1884.1illicurie (1866.0illicuries Intravenous Contrast Given 05/28/19 1735)     Initial Impression / Assessment and Plan / ED Course  I have reviewed the triage vital signs and the nursing notes.  Pertinent labs & imaging results that were available during my care of the patient were reviewed by me and considered in my medical decision making (see chart for details).        [age]ear old female with no significant medical history arrives  to the emergency department with chief complaint of being unresponsive, found to be in PEA arrest on arrival after using cocaine and ketamine.  On arrival to the ED, patient taken emergently from triage to resuscitaiotn room and CPR initiated.  Initial rhythm bradycardia with PEA arrest.  Given epinephrine, narcan with ROSC noted at time of intubation.  Initial hypertension noted followed by hypotension for which she was initiated on pressors goal MAP 65, which were discontinued after she noted to have improvement of BP. Initially ordered empiric abx given limited hx, CT head/CSpine.  ETT withdrawn.  CT head shows SAH. Consulted NSU and ordered CTA. Transferred to [facility] ICU for further care by NSU and CC.  Discussed initial events and care with boyfriend Marjory Lies and discussed presence of [place] with father of her children/POA, Jacqlyn Marolf.   Final Clinical Impressions(s) / ED Diagnoses   Final diagnoses:  PEA (Pulseless electrical activity) ([place])  Cardiac arrest ([place])  SAH (subarachnoid hemorrhage) ([place])  Cocaine abuse [facility])  Drug use    ED Discharge Orders    None       Gareth Morgan, MD 05/28/19 2004

## 2019-05-28 NOTE — ED Triage Notes (Signed)
Pt arrives POV with female companion. Per female companion, they have been up all night doing ketamine and cocaine. Pt unresponsive on arrival.

## 2019-05-28 NOTE — Code Documentation (Addendum)
Equal breath sounds 7.5 tube , Dr Billy Fischer 24 at the lip

## 2019-05-28 NOTE — Progress Notes (Signed)
Patient ETT pulled back 2 cm per MD order; Found at 27 cm  @ lip and pulled back to 25 cm @ lip. Patient tolerated well.

## 2019-05-28 NOTE — ED Notes (Signed)
Pt placed in room and compressions immediately started.

## 2019-05-28 NOTE — Significant Event (Signed)
Declaration of death by neurological criteria:  Diagnosis: cardiac arrest HH 5 SAH  Imaging: diffuse SAH blood  Confounding factors: THC and cocaine use   Clinical examination:   Response to painful stimulation to 4 limbs: absent  Response to painful central stimuli: absent  Pupillary response: fixed and dilated  Corneal response: absent   Oculocephalic response: absent  Vestibulo-occular response: not -tested  Gag reflex: absent  Cough reflex: absent   Apnea testing: no spontaneous respiration on abriviated testing   Blood gas results:  ABG    Component Value Date/Time   PHART 7.573 (H) 05/28/2019 1549   PCO2ART 17.0 (LL) 05/28/2019 1549   PO2ART 168.0 (H) 05/28/2019 1549   HCO3 16.2 (L) 05/28/2019 1549   TCO2 17 (L) 05/28/2019 1549   ACIDBASEDEF 5.0 (H) 05/28/2019 1549   O2SAT 100.0 05/28/2019 1549     Confirmatory testing: no cerebral blood flow on CTA and NM brain perfusion.    The patient was declared dead by neurological criteria at 18:36  [place] donor services was notified.  Kipp Brood, MD [facility] ICU Physician [place]  Pager: [phone] Mobile: [phone] After hours: [phone].  05/28/2019, 6:56 PM

## 2019-05-28 NOTE — Significant Event (Signed)
Declaration of death by neurological criteria:  Diagnosis: cardiac arrest HH 5 SAH  Imaging: diffuse SAH blood  Confounding factors: THC and cocaine use   Clinical examination:  BP (!) 83/68 Comment: neo gtt titrated  Pulse 95   Temp 98.4 F (36.9 C)   Resp 20   Ht 5\' 8"  (1.727 m)   Wt 56.3 kg   SpO2 100%   BMI 18.87 kg/m               Response to painful stimulation to 4 limbs: absent             Response to painful central stimuli: absent             Pupillary response: fixed and dilated             Corneal response: absent              Oculocephalic response: absent             Vestibulo-occular response: deferred             Gag reflex: absent             Cough reflex: absent  Apnea testing: no spontaneous respiration on testing   Blood gas results:  ABG before apnea testing: Results for [name], [name] (MRN [id number]) as of Jun 01, 2019 01:12  Ref. Range 2019-06-01 00:25  Sample type Unknown ARTERIAL  pH, Arterial Latest Ref Range: 7.350 - 7.450  7.330 (L)  pCO2 arterial Latest Ref Range: 32.0 - 48.0 mmHg 36.4  pO2, Arterial Latest Ref Range: 83.0 - 108.0 mmHg 169.0 (H)  TCO2 Latest Ref Range: 22 - 32 mmol/L 20 (L)  Acid-base deficit Latest Ref Range: 0.0 - 2.0 mmol/L 6.0 (H)  Bicarbonate Latest Ref Range: 20.0 - 28.0 mmol/L 18.9 (L)  O2 Saturation Latest Units: % 99.0  Patient temperature Unknown 101.3 F  Collection site Unknown ARTERIAL LINE   ABG after apnea testing    Component Value Date/Time   PHART 7.143 (LL) 06-01-2019 0044   PCO2ART 66.2 (HH) 06-01-19 0044   PO2ART 149.0 (H) June 01, 2019 0044   HCO3 22.4 06-01-19 0044   TCO2 24 06-01-19 0044   ACIDBASEDEF 7.0 (H) 01-Jun-2019 0044   O2SAT 98.0 06-01-19 0044    Confirmatory testing: no cerebral blood flow on CTA and NM brain perfusion.   The patient was declared dead by neurological criteria at 06-01-19 12:45 AM  [employer] donor services was updated.  Paige Whitney, M.D. [facility]  Pulmonary/Critical Care Medicine After hours pager: [phone]  June 01, 2019 1:12 AM

## 2019-05-28 NOTE — Code Documentation (Signed)
Pt brought in by female companion. Per female, pt has been up all night doing ketamine and cocaine.

## 2019-05-28 NOTE — Procedures (Signed)
Arterial Catheter Insertion Procedure Note Paige Whitney [id number] May 27, 1966  Procedure: Insertion of Arterial Catheter  Indications: Blood pressure monitoring  Procedure Details Consent: Risks of procedure as well as the alternatives and risks of each were explained to the (patient/caregiver).  Consent for procedure obtained. Time Out: Verified patient identification, verified procedure, site/side was marked, verified correct patient position, special equipment/implants available, medications/allergies/relevent history reviewed, required imaging and test results available.  Performed  Maximum sterile technique was used including antiseptics, cap, gloves, gown, hand hygiene, mask and sheet. Skin prep: Chlorhexidine; local anesthetic administered 20 gauge catheter was inserted into right radial artery using the Seldinger technique. ULTRASOUND GUIDANCE USED: NO Evaluation Blood flow good; BP tracing good. Complications: No apparent complications.   Paige Whitney 05/28/2019

## 2019-05-28 NOTE — H&P (Signed)
NAME:  Paige Whitney, MRN:  [id number], DOB:  May 17, 1966, LOS: 0 ADMISSION DATE:  05/28/2019,  REFERRING MD:  Dr Billy Fischer, CHIEF COMPLAINT:  Unresponsive   Brief History     History of present illness   53 year old woman with little past medical history, substance abuse, brought by boyfriend to the ED for unresponsiveness.  Reportedly had been using cocaine and ketamine all through the night, was poorly responsive for about 30 minutes morning 12/5, then awoke and was confused, garbled speech, lethargic.  Subsequently became unconscious again with shallow respirations which prompted transfer.  On arrival to the ED found to be pulseless, CPR initiated for reported PEA.  No response to Narcan.  Pulse returned after 10 minutes, establishment of airway.  Hypotensive and started on norepinephrine, currently on 7.  Head CT in ED with diffuse subarachnoid hemorrhage, intraventricular extension, question intracranial aneurysm rupture.   Past Medical History   has no past medical history on file.   Significant Hospital Events     Consults:  Neurosurgery  Procedures:    Significant Diagnostic Tests:  CT head 12/5 >> extensive subarachnoid blood with intraventricular extension CT angio head / neck 12/5 >>   Micro Data:  Blood 12/5 >>  Urine 12/5 >>  resp 12/5 >>   Antimicrobials:  vanco 12/5 x 1  cefepime 12/5 x 1 Flagyl 12/5 X 1 unasyn 12/5 >>   Interim history/subjective:  Unresponsive.  Apparently no medications were given/required to establish airway  Objective   Blood pressure 108/69, pulse 82, temperature (!) 92.7 F (33.7 C), resp. rate 20, height 5\' 8"  (1.727 m), SpO2 100 %.    Vent Mode: PRVC FiO2 (%):  [100 %] 100 % Set Rate:  [15 bmp] 15 bmp Vt Set:  [510 mL] 510 mL PEEP:  [5 cmH20] 5 cmH20 Plateau Pressure:  [16 cmH20] 16 cmH20  No intake or output data in the 24 hours ending 05/28/19 1014 There were no vitals filed for this visit.  Examination: General: Thin  woman, appears stated age, ventilated HENT: Pupils fixed and dilated, no response to light.  ET tube in place Lungs: Clear bilaterally, no crackles or wheezes, ventilated Chest: Bilateral breast implants Cardiovascular: Regular, no murmur Abdomen: Soft, nondistended, hypoactive bowel sounds Extremities: No edema Neuro: Comatose, completely unresponsive.  Breathing the set rate on the ventilator. Pupils fixed and dilated.  Doll's eyes negative, corneal negative, gag negative.  Babinski equivocal GU: Foley in place  Resolved Hospital Problem list     Assessment & Plan:  Cardiopulmonary arrest, presumed due to substances (cocaine, ketamine).  Primary respiratory suppression. Newly identified [place] contributing and possibly primary cause Acute respiratory failure, VDRF -PRVC 8 cc/kg, increased respiratory rate to compensate for lactic acidosis -Retract ET tube 2 cm based on post intubation chest x-ray -Not a candidate for hypothermia protocol due to intracranial hemorrhage -follow ABG this afternoon and adjust MV  Shock, presumed medication related, consider hypovolemia, sepsis, cardiogenic component due to substances -Low-dose norepinephrine ordered via peripheral IV.  May need central access if persistent need -Echocardiogram -Blood in urine cultures obtained, respiratory culture pending -Suspect midlung infiltrates on chest x-ray or due to her breast implants but will cover empirically with -Unasyn for possible aspiration event pending culture data  Subarachnoid hemorrhage.  Consider aneurysmal rupture, hypertensive emergency due to cocaine.   Comatose.  Clinical exam very concerning for superimposed hypoxic brain injury -Neurosurgery consulted by ED MD.  Plan transfer to [facility] as soon as possible for formal neurosurgery  evaluation and probably ventricular drain. -CT angiography ordered and pending -PAD protocol > prn fentanyl and versed  Lactic acidosis, anion gap metabolic acidosis  following CPR -Follow lactate for clearance with restoration of perfusion -Increased respiratory rate as above  At risk renal injury, hepatic injury following CPR -Follow BMP, urine output, LFT and coag  Hyperglycemia -ICU HG protocol ordered  Best practice:  Diet: NPO Pain/Anxiety/Delirium protocol (if indicated): intermittent fent + midaz VAP protocol (if indicated): ordered 12/5 DVT prophylaxis: SCD, no heparin in setting SAH GI prophylaxis: protonix Glucose control: ICU HG protocol Mobility: BR Code Status: Full  Family Communication: Dr Dalene Seltzer has discussed with boyfriend and ex-husband in ED. I will speak with them as well.  Disposition: ICU at [facility], 4N planned  Labs   CBC: Recent Labs  Lab 05/28/19 0839 05/28/19 0916  WBC 17.0*  --   NEUTROABS 13.1*  --   HGB 13.1 11.9*  HCT 43.2 35.0*  MCV 94.9  --   PLT 250  --     Basic Metabolic Panel: Recent Labs  Lab 05/28/19 0839 05/28/19 0916  NA 139 139  K 3.4* 3.5  CL 101 105  CO2 18*  --   GLUCOSE 363* 283*  BUN 12 11  CREATININE 0.95 0.70  CALCIUM 8.9  --    GFR: CrCl cannot be calculated (Unknown ideal weight.). Recent Labs  Lab 05/28/19 0839  WBC 17.0*    Liver Function Tests: Recent Labs  Lab 05/28/19 0839  AST 42*  ALT 40  ALKPHOS 67  BILITOT 0.4  PROT 6.7  ALBUMIN 3.8   No results for input(s): LIPASE, AMYLASE in the last 168 hours. No results for input(s): AMMONIA in the last 168 hours.  ABG    Component Value Date/Time   PHART 7.292 (L) 05/28/2019 0953   PCO2ART 31.6 (L) 05/28/2019 0953   PO2ART 416 (H) 05/28/2019 0953   HCO3 15.5 (L) 05/28/2019 0953   TCO2 19 (L) 05/28/2019 0916   ACIDBASEDEF 10.5 (H) 05/28/2019 0953   O2SAT 99.2 05/28/2019 0953     Coagulation Profile: Recent Labs  Lab 05/28/19 0839  INR 1.2    Cardiac Enzymes: No results for input(s): CKTOTAL, CKMB, CKMBINDEX, TROPONINI in the last 168 hours.  HbA1C: No results found for: HGBA1C  CBG:  Recent Labs  Lab 05/28/19 0845  GLUCAP 285*    Review of Systems:   Unable to obtain  Past Medical History  She,  has no past medical history on file.   Surgical History   No past surgical history on file.   Social History      Family History   Her family history is not on file.   Allergies Allergies  Allergen Reactions  . Erythromycin Nausea And Vomiting     Home Medications  Prior to Admission medications   Not on File     Critical care time: 72     Levy Pupa, MD, PhD 05/28/2019, 11:05 AM [place] [facility] [phone] or if no answer [phone]

## 2019-05-28 NOTE — ED Notes (Signed)
ED TO INPATIENT HANDOFF REPORT  ED Nurse Name and Phone #: [phone]  S Name/Age/Gender Paige Whitney 53 y.o. female Room/Bed: RESA/RESA  Code Status   Code Status: Not on file  Home/SNF/Other Home Patient oriented to: self Is this baseline? No   Triage Complete: Triage complete  Chief Complaint non-responsive, shallow breathing  Triage Note Pt arrives POV with female companion. Per female companion, they have been up all night doing ketamine and cocaine. Pt unresponsive on arrival.   Allergies Allergies  Allergen Reactions  . Erythromycin Nausea And Vomiting    Level of Care/Admitting Diagnosis ED Disposition    ED Disposition Condition Comment   Admit  Hospital Area: [facility] [[id number]]  Level of Care: ICU [6]  Covid Evaluation: Confirmed COVID Negative  Diagnosis: Cardiopulmonary arrest [facility]) [[id number]]  Admitting Physician: Leslye Peer [[id number]]  Attending Physician: Leslye Peer [[id number]]  Estimated length of stay: 3 - 4 days  Certification:: I certify this patient will need inpatient services for at least 2 midnights  PT Class (Do Not Modify): Inpatient [101]  PT Acc Code (Do Not Modify): Private [1]       B Medical/Surgery History No past medical history on file. No past surgical history on file.   A IV Location/Drains/Wounds Patient Lines/Drains/Airways Status   Active Line/Drains/Airways    Name:   Placement date:   Placement time:   Site:   Days:   Peripheral IV 05/28/19 Left Antecubital   05/28/19    -    Antecubital   less than 1   Peripheral IV 05/28/19 Right Forearm   05/28/19    0945    Forearm   less than 1   Peripheral IV 05/28/19 Left Antecubital   05/28/19    0820    Antecubital   less than 1   NG/OG Tube Orogastric 18 Fr. Right mouth Xray Measured external length of tube   05/28/19    0930    Right mouth   less than 1   Urethral Catheter Delmar Dondero, RN Non-latex;Temperature probe;Straight-tip 14 Fr.   05/28/19    1000     Non-latex;Temperature probe;Straight-tip   less than 1   Airway 7.5 mm   05/28/19    0828     less than 1   Intraosseous Line 05/28/19   05/28/19    0822    Left   less than 1          Intake/Output Last 24 hours  Intake/Output Summary (Last 24 hours) at 05/28/2019 1329 Last data filed at 05/28/2019 1130 Gross per 24 hour  Intake 46.31 ml  Output -  Net 46.31 ml    Labs/Imaging Results for orders placed or performed during the hospital encounter of 05/28/19 (from the past 48 hour(s))  Comprehensive metabolic panel     Status: Abnormal   Collection Time: 05/28/19  8:39 AM  Result Value Ref Range   Sodium 139 135 - 145 mmol/L    Comment: REPEATED TO VERIFY   Potassium 3.4 (L) 3.5 - 5.1 mmol/L   Chloride 101 98 - 111 mmol/L    Comment: REPEATED TO VERIFY   CO2 18 (L) 22 - 32 mmol/L    Comment: REPEATED TO VERIFY   Glucose, Bld 363 (H) 70 - 99 mg/dL   BUN 12 6 - 20 mg/dL   Creatinine, Ser 1.19 0.44 - 1.00 mg/dL   Calcium 8.9 8.9 - 14.7 mg/dL   Total Protein 6.7 6.5 - 8.1  g/dL   Albumin 3.8 3.5 - 5.0 g/dL   AST 42 (H) 15 - 41 U/L   ALT 40 0 - 44 U/L   Alkaline Phosphatase 67 38 - 126 U/L   Total Bilirubin 0.4 0.3 - 1.2 mg/dL   GFR calc non Af Amer >60 >60 mL/min   GFR calc Af Amer >60 >60 mL/min   Anion gap 20 (H) 5 - 15    Comment: REPEATED TO VERIFY Performed at [facility], [address] [place], [place] [address]   CBC WITH DIFFERENTIAL     Status: Abnormal   Collection Time: 05/28/19  8:39 AM  Result Value Ref Range   WBC 17.0 (H) 4.0 - 10.5 K/uL   RBC 4.55 3.87 - 5.11 MIL/uL   Hemoglobin 13.1 12.0 - 15.0 g/dL   HCT 60.4 54.0 - 98.1 %   MCV 94.9 80.0 - 100.0 fL   MCH 28.8 26.0 - 34.0 pg   MCHC 30.3 30.0 - 36.0 g/dL   RDW 19.1 (H) 47.8 - 29.5 %   Platelets 250 150 - 400 K/uL   nRBC 0.2 0.0 - 0.2 %   Neutrophils Relative % 77 %   Neutro Abs 13.1 (H) 1.7 - 7.7 K/uL   Lymphocytes Relative 15 %   Lymphs Abs 2.5 0.7 - 4.0 K/uL   Monocytes  Relative 5 %   Monocytes Absolute 0.8 0.1 - 1.0 K/uL   Eosinophils Relative 0 %   Eosinophils Absolute 0.0 0.0 - 0.5 K/uL   Basophils Relative 1 %   Basophils Absolute 0.1 0.0 - 0.1 K/uL   Immature Granulocytes 2 %   Abs Immature Granulocytes 0.38 (H) 0.00 - 0.07 K/uL    Comment: Performed at [facility], [address] [place], [place] [address]  APTT     Status: Abnormal   Collection Time: 05/28/19  8:39 AM  Result Value Ref Range   aPTT 42 (H) 24 - 36 seconds    Comment:        IF BASELINE aPTT IS ELEVATED, SUGGEST PATIENT RISK ASSESSMENT BE USED TO DETERMINE APPROPRIATE ANTICOAGULANT THERAPY. Performed at [facility], [address], [place], [place] [address]   Protime-INR     Status: None   Collection Time: 05/28/19  8:39 AM  Result Value Ref Range   Prothrombin Time 15.0 11.4 - 15.2 seconds   INR 1.2 0.8 - 1.2    Comment: (NOTE) INR goal varies based on device and disease states. Performed at [facility], [address], [place], [place] [address]   Urinalysis, Routine w reflex microscopic     Status: Abnormal   Collection Time: 05/28/19  8:42 AM  Result Value Ref Range   Color, Urine STRAW (A) YELLOW   APPearance CLEAR CLEAR   Specific Gravity, Urine 1.007 1.005 - 1.030   pH 7.0 5.0 - 8.0   Glucose, UA >=500 (A) NEGATIVE mg/dL   Hgb urine dipstick SMALL (A) NEGATIVE   Bilirubin Urine NEGATIVE NEGATIVE   Ketones, ur 5 (A) NEGATIVE mg/dL   Protein, ur 952 (A) NEGATIVE mg/dL   Nitrite NEGATIVE NEGATIVE   Leukocytes,Ua NEGATIVE NEGATIVE   RBC / HPF 0-5 0 - 5 RBC/hpf   WBC, UA 0-5 0 - 5 WBC/hpf   Bacteria, UA NONE SEEN NONE SEEN    Comment: Performed at [facility], [address], [place], [place] [address]  CBG monitoring, ED     Status:  Abnormal   Collection Time: 05/28/19  8:45 AM  Result Value Ref Range   Glucose-Capillary 285 (H) 70 - 99 mg/dL  Rapid urine drug screen  (hospital performed)     Status: Abnormal   Collection Time: 05/28/19  8:46 AM  Result Value Ref Range   Opiates NONE DETECTED NONE DETECTED   Cocaine POSITIVE (A) NONE DETECTED   Benzodiazepines NONE DETECTED NONE DETECTED   Amphetamines NONE DETECTED NONE DETECTED   Tetrahydrocannabinol POSITIVE (A) NONE DETECTED   Barbiturates NONE DETECTED NONE DETECTED    Comment: (NOTE) DRUG SCREEN FOR MEDICAL PURPOSES ONLY.  IF CONFIRMATION IS NEEDED FOR ANY PURPOSE, NOTIFY LAB WITHIN 5 DAYS. LOWEST DETECTABLE LIMITS FOR URINE DRUG SCREEN Drug Class                     Cutoff (ng/mL) Amphetamine and metabolites    1000 Barbiturate and metabolites    200 Benzodiazepine                 200 Tricyclics and metabolites     300 Opiates and metabolites        300 Cocaine and metabolites        300 THC                            50 Performed at [facility], [address], [place], [place] [address]   Ethanol     Status: None   Collection Time: 05/28/19  8:46 AM  Result Value Ref Range   Alcohol, Ethyl (B) <10 <10 mg/dL    Comment: (NOTE) Lowest detectable limit for serum alcohol is 10 mg/dL. For medical purposes only. Performed at [facility], [address] [place], [place] [address]   Acetaminophen level     Status: Abnormal   Collection Time: 05/28/19  8:46 AM  Result Value Ref Range   Acetaminophen (Tylenol), Serum <10 (L) 10 - 30 ug/mL    Comment: (NOTE) Therapeutic concentrations vary significantly. A range of 10-30 ug/mL  may be an effective concentration for many patients. However, some  are best treated at concentrations outside of this range. Acetaminophen concentrations >150 ug/mL at 4 hours after ingestion  and >50 ug/mL at 12 hours after ingestion are often associated with  toxic reactions. Performed at [facility], [address], [place], [place] [address]   Salicylate level     Status: None   Collection  Time: 05/28/19  8:46 AM  Result Value Ref Range   Salicylate Lvl <7.0 2.8 - 30.0 mg/dL    Comment: Performed at [facility], [address], [place], [place] [address]  SARS Coronavirus 2 by RT PCR (hospital order, performed in [facility] hospital lab) Nasopharyngeal Nasopharyngeal Swab     Status: None   Collection Time: 05/28/19  8:55 AM   Specimen: Nasopharyngeal Swab  Result Value Ref Range   SARS Coronavirus 2 NEGATIVE NEGATIVE    Comment: (NOTE) SARS-CoV-2 target nucleic acids are NOT DETECTED. The SARS-CoV-2 RNA is generally detectable in upper and lower respiratory specimens during the acute phase of infection. The lowest concentration of SARS-CoV-2 viral copies this assay can detect is 250 copies / mL. A negative result does not preclude SARS-CoV-2 infection and should not be used as the sole basis for treatment or other patient management decisions.  A negative result may occur with improper specimen collection / handling,  submission of specimen other than nasopharyngeal swab, presence of viral mutation(s) within the areas targeted by this assay, and inadequate number of viral copies (<250 copies / mL). A negative result must be combined with clinical observations, patient history, and epidemiological information. Fact Sheet for Patients:   BoilerBrush.com.cy Fact Sheet for Healthcare Providers: https://pope.com/ This test is not yet approved or cleared  by the [place] FDA and has been authorized for detection and/or diagnosis of SARS-CoV-2 by FDA under an Emergency Use Authorization (EUA).  This EUA will remain in effect (meaning this test can be used) for the duration of the COVID-19 declaration under Section 564(b)(1) of the Act, 21 U.S.C. section 360bbb-3(b)(1), unless the authorization is terminated or revoked sooner. Performed at [facility], [address], [place], [place] [address]   I-Stat beta hCG blood, ED     Status: None   Collection Time: 05/28/19  9:14 AM  Result Value Ref Range   I-stat hCG, quantitative <5.0 <5 mIU/mL   Comment 3            Comment:   GEST. AGE      CONC.  (mIU/mL)   <=1 WEEK        5 - 50     2 WEEKS       50 - 500     3 WEEKS       100 - 10,000     4 WEEKS     1,000 - 30,000        FEMALE AND NON-PREGNANT FEMALE:     LESS THAN 5 mIU/mL   I-stat chem 8, ED (not at [facility] or [facility])     Status: Abnormal   Collection Time: 05/28/19  9:16 AM  Result Value Ref Range   Sodium 139 135 - 145 mmol/L   Potassium 3.5 3.5 - 5.1 mmol/L   Chloride 105 98 - 111 mmol/L   BUN 11 6 - 20 mg/dL   Creatinine, Ser 6.04 0.44 - 1.00 mg/dL   Glucose, Bld 540 (H) 70 - 99 mg/dL   Calcium, Ion 9.81 (L) 1.15 - 1.40 mmol/L   TCO2 19 (L) 22 - 32 mmol/L   Hemoglobin 11.9 (L) 12.0 - 15.0 g/dL   HCT 19.1 (L) 47.8 - 29.5 %  Lactic acid, plasma     Status: Abnormal   Collection Time: 05/28/19  9:33 AM  Result Value Ref Range   Lactic Acid, Venous 6.9 (HH) 0.5 - 1.9 mmol/L    Comment: CRITICAL RESULT CALLED TO, READ BACK BY AND VERIFIED WITH: CLAPP,S. RN AT 1021 05/28/19 MULLINS,T Performed at [facility], [address] [place], [place] [address]   Blood gas, arterial     Status: Abnormal   Collection Time: 05/28/19  9:53 AM  Result Value Ref Range   FIO2 100.00    Delivery systems VENTILATOR    Mode PRESSURE REGULATED VOLUME CONTROL    VT 510 mL   LHR 15 resp/min   Peep/cpap +5 cm H20   pH, Arterial 7.292 (L) 7.350 - 7.450   pCO2 arterial 31.6 (L) 32.0 - 48.0 mmHg   pO2, Arterial 416 (H) 83.0 - 108.0 mmHg   Bicarbonate 15.5 (L) 20.0 - 28.0 mmol/L   Acid-base deficit 10.5 (H) 0.0 - 2.0 mmol/L   O2 Saturation 99.2 %   Patient temperature 93.7    Collection site LEFT RADIAL    Drawn by [id number]    Allens test (pass/fail) PASS  PASS    Comment: Performed at [facility], [address],  [place], [place] [address]   Ct Angio Head W Or Wo Contrast  Result Date: 05/28/2019 CLINICAL DATA:  Unresponsive. Subarachnoid hemorrhage shown by previous CT. EXAM: CT ANGIOGRAPHY HEAD AND NECK TECHNIQUE: Multidetector CT imaging of the head and neck was performed using the standard protocol during bolus administration of intravenous contrast. Multiplanar CT image reconstructions and MIPs were obtained to evaluate the vascular anatomy. Carotid stenosis measurements (when applicable) are obtained utilizing NASCET criteria, using the distal internal carotid diameter as the denominator. CONTRAST:  155mL OMNIPAQUE IOHEXOL 350 MG/ML SOLN COMPARISON:  Head CT earlier same day FINDINGS: CTA NECK FINDINGS Aortic arch: Normal Right carotid system: Common carotid artery widely patent to the bifurcation. Carotid bifurcation is normal without atherosclerotic disease. Cervical ICA is normal. Left carotid system: Common carotid artery widely patent to the bifurcation. Carotid bifurcation is normal without atherosclerotic disease. Cervical ICA is normal. Vertebral arteries: Both vertebral arteries widely patent with the left being dominant. Skeleton: Ordinary cervical spondylosis. Other neck: No mass or lymphadenopathy. Upper chest: Patient intubated. Mild pleural and parenchymal scarring at the lung apices. Review of the MIP images confirms the above findings CTA HEAD FINDINGS Anterior circulation: Both internal carotid arteries are patent at the skull base level but there is no visible intracranial flow. Flow is present in the external branches and therefore this is most concerning for brain death, though that cannot be established by this technique. Posterior circulation: Similarly, no flow can be seen in the vertebrobasilar system. Venous sinuses: No significant venous sinus opacification. Anatomic variants: None Review of the MIP images confirms the above findings IMPRESSION: Intracranial flow cannot be demonstrated,  suggesting the possibility of brain death. This cannot be established using this technique. However, I am concerned about the possibility. External carotid branches do show flow and we should be seeing intracranial flow in the anterior and posterior circulation major vessels. Electronically Signed   By: Nelson Chimes M.D.   On: 05/28/2019 11:18   Ct Head Wo Contrast  Result Date: 05/28/2019 CLINICAL DATA:  Unresponsive, hypotensive, suspected drug overdose. EXAM: CT HEAD WITHOUT CONTRAST CT CERVICAL SPINE WITHOUT CONTRAST TECHNIQUE: Multidetector CT imaging of the head and cervical spine was performed following the standard protocol without intravenous contrast. Multiplanar CT image reconstructions of the cervical spine were also generated. COMPARISON:  None. FINDINGS: CT HEAD FINDINGS Brain: Diffuse subarachnoid hemorrhage fills basilar cisterns and sulci. Signs of intraventricular blood as well in the lateral ventricle on the left. Basilar cisterns are no longer visible. Ventricles remain visible. No midline shift. No visible intraparenchymal lesion. Vascular: Vascular structures not visible. Pattern of hemorrhage suggests ruptured intracranial aneurysm. There is more pronounced focal clot anterior to the sella and along the anterior interhemispheric fissure. Skull: Normal. Negative for fracture or focal lesion. Sinuses/Orbits: No acute finding. Other: None. CT CERVICAL SPINE FINDINGS Alignment: Mild straightening of normal cervical lordosis likely positional. Skull base and vertebrae: No acute fracture. No primary bone lesion or focal pathologic process. Soft tissues and spinal canal: Assessment of prevertebral soft tissues limited by endotracheal tube and Derek to. Disc levels: Multilevel spinal degenerative change with facet arthropathy at C3-4, C4-5 and C5-6. Minimal anterolisthesis of C4 on C5. Most pronounced disc space narrowing and uncovertebral degenerative changes are noted at the C5-6 level. Upper  chest: Mild increased interstitial thickening at the lung apices. Other: None IMPRESSION: 1. Diffuse subarachnoid hemorrhage as described with signs of intraventricular  blood as well. 2. Pattern of hemorrhage suggests ruptured intracranial aneurysm. 3. No evidence for acute traumatic injury to the cervical spine. 4. Multilevel spinal degenerative change and facet arthropathy. 5. Increased interstitial thickening at the lung apices may represent pneumonitis, scarring or edema. 6. Critical Value/emergent results were called by telephone at the time of interpretation on 05/28/2019 at 10:05 am to providerERIN [facility] , who verbally acknowledged these results. Electronically Signed   By: Donzetta KohutGeoffrey  Wile M.D.   On: 05/28/2019 10:16   Ct Angio Neck W And/or Wo Contrast  Result Date: 05/28/2019 CLINICAL DATA:  Unresponsive. Subarachnoid hemorrhage shown by previous CT. EXAM: CT ANGIOGRAPHY HEAD AND NECK TECHNIQUE: Multidetector CT imaging of the head and neck was performed using the standard protocol during bolus administration of intravenous contrast. Multiplanar CT image reconstructions and MIPs were obtained to evaluate the vascular anatomy. Carotid stenosis measurements (when applicable) are obtained utilizing NASCET criteria, using the distal internal carotid diameter as the denominator. CONTRAST:  100mL OMNIPAQUE IOHEXOL 350 MG/ML SOLN COMPARISON:  Head CT earlier same day FINDINGS: CTA NECK FINDINGS Aortic arch: Normal Right carotid system: Common carotid artery widely patent to the bifurcation. Carotid bifurcation is normal without atherosclerotic disease. Cervical ICA is normal. Left carotid system: Common carotid artery widely patent to the bifurcation. Carotid bifurcation is normal without atherosclerotic disease. Cervical ICA is normal. Vertebral arteries: Both vertebral arteries widely patent with the left being dominant. Skeleton: Ordinary cervical spondylosis. Other neck: No mass or lymphadenopathy.  Upper chest: Patient intubated. Mild pleural and parenchymal scarring at the lung apices. Review of the MIP images confirms the above findings CTA HEAD FINDINGS Anterior circulation: Both internal carotid arteries are patent at the skull base level but there is no visible intracranial flow. Flow is present in the external branches and therefore this is most concerning for brain death, though that cannot be established by this technique. Posterior circulation: Similarly, no flow can be seen in the vertebrobasilar system. Venous sinuses: No significant venous sinus opacification. Anatomic variants: None Review of the MIP images confirms the above findings IMPRESSION: Intracranial flow cannot be demonstrated, suggesting the possibility of brain death. This cannot be established using this technique. However, I am concerned about the possibility. External carotid branches do show flow and we should be seeing intracranial flow in the anterior and posterior circulation major vessels. Electronically Signed   By: Paulina FusiMark  Shogry M.D.   On: 05/28/2019 11:18   Ct Cervical Spine Wo Contrast  Result Date: 05/28/2019 CLINICAL DATA:  Unresponsive, hypotensive, suspected drug overdose. EXAM: CT HEAD WITHOUT CONTRAST CT CERVICAL SPINE WITHOUT CONTRAST TECHNIQUE: Multidetector CT imaging of the head and cervical spine was performed following the standard protocol without intravenous contrast. Multiplanar CT image reconstructions of the cervical spine were also generated. COMPARISON:  None. FINDINGS: CT HEAD FINDINGS Brain: Diffuse subarachnoid hemorrhage fills basilar cisterns and sulci. Signs of intraventricular blood as well in the lateral ventricle on the left. Basilar cisterns are no longer visible. Ventricles remain visible. No midline shift. No visible intraparenchymal lesion. Vascular: Vascular structures not visible. Pattern of hemorrhage suggests ruptured intracranial aneurysm. There is more pronounced focal clot anterior  to the sella and along the anterior interhemispheric fissure. Skull: Normal. Negative for fracture or focal lesion. Sinuses/Orbits: No acute finding. Other: None. CT CERVICAL SPINE FINDINGS Alignment: Mild straightening of normal cervical lordosis likely positional. Skull base and vertebrae: No acute fracture. No primary bone lesion or focal pathologic process. Soft tissues and spinal canal: Assessment of prevertebral  soft tissues limited by endotracheal tube and Derek to. Disc levels: Multilevel spinal degenerative change with facet arthropathy at C3-4, C4-5 and C5-6. Minimal anterolisthesis of C4 on C5. Most pronounced disc space narrowing and uncovertebral degenerative changes are noted at the C5-6 level. Upper chest: Mild increased interstitial thickening at the lung apices. Other: None IMPRESSION: 1. Diffuse subarachnoid hemorrhage as described with signs of intraventricular blood as well. 2. Pattern of hemorrhage suggests ruptured intracranial aneurysm. 3. No evidence for acute traumatic injury to the cervical spine. 4. Multilevel spinal degenerative change and facet arthropathy. 5. Increased interstitial thickening at the lung apices may represent pneumonitis, scarring or edema. 6. Critical Value/emergent results were called by telephone at the time of interpretation on 05/28/2019 at 10:05 am to providerERIN [facility] , who verbally acknowledged these results. Electronically Signed   By: Donzetta Kohut M.D.   On: 05/28/2019 10:16   Dg Chest Portable 1 View  Addendum Date: 05/28/2019   ADDENDUM REPORT: 05/28/2019 09:50 ADDENDUM: These results were called by telephone at the time of interpretation on 05/28/2019 at 9:50 am to provider [facility] , who verbally acknowledged these results. Electronically Signed   By: Donzetta Kohut M.D.   On: 05/28/2019 09:50   Result Date: 05/28/2019 CLINICAL DATA:  Tube placement, potential overdose. EXAM: PORTABLE CHEST 1 VIEW COMPARISON:  None FINDINGS: Endotracheal  tube in place in the distal trachea approximately 8 mm above the carina, directed towards the right mainstem bronchus. Variety of hardware projects over the chest including pacer defibrillator pads. Gastric tube courses through in off the field of the radiograph. Cardiomediastinal contours are likely normal accounting for portable technique. Added density over the right left chest suggest breast implants. Suggestion of background interstitial prominence without consolidation or pleural effusion. No acute bone finding. IMPRESSION: 1. Endotracheal tube in place in the distal trachea, approximately 8 mm above the carina, directed towards the right mainstem bronchus. Retraction approximately 2 cm may be helpful for more optimal placement. 2. Gastric tube courses through the field of the radiograph. 3. Subtle increased interstitial markings are nonspecific and density is accentuated behind presumed bilateral breast implants in the mid chest. Findings may represent asymmetric edema or mild pneumonitis. Electronically Signed: By: Donzetta Kohut M.D. On: 05/28/2019 09:45    Pending Labs Unresulted Labs (From admission, onward)    Start     Ordered   12-Jun-2019 0500  Creatinine, serum  Tomorrow morning,   R     05/28/19 1228   05/28/19 0842  Lactic acid, plasma  Now then every 2 hours,   STAT     05/28/19 0845   05/28/19 0842  Blood Culture (routine x 2)  BLOOD CULTURE X 2,   STAT     05/28/19 0845   05/28/19 0842  Urine culture  ONCE - STAT,   STAT     05/28/19 0845   Signed and Held  HIV Antibody (routine testing w rflx)  (HIV Antibody (Routine testing w reflex) panel)  Once,   R     Signed and Held   Signed and Held  Procalcitonin  Once,   R     Signed and Held   Signed and Held  Culture, respiratory (tracheal aspirate)  Once,   R     Signed and Held   Medical illustrator and Held  Blood gas, arterial  Once,   R     Signed and Held   Signed and Held  CBC  Tomorrow morning,   R  Signed and Held   Signed and  Held  Basic metabolic panel  Tomorrow morning,   R     Signed and Held   Signed and Held  Blood gas, arterial  Tomorrow morning,   R     Signed and Held   Signed and Held  Magnesium  Tomorrow morning,   R     Signed and Held          Vitals/Pain Today's Vitals   05/28/19 1230 05/28/19 1245 05/28/19 1300 05/28/19 1308  BP: 134/90  133/87   Pulse: 95 93 91   Resp: Temp:      SpO2: 98% 100% 99%   Weight:    57.2 kg  Height:        Isolation Precautions No active isolations  Medications Medications  ceFEPIme (MAXIPIME) 2 g in sodium chloride 0.9 % 100 mL IVPB (2 g Intravenous New Bag/Given 05/28/19 1303)  metroNIDAZOLE (FLAGYL) IVPB 500 mg (has no administration in time range)  norepinephrine (LEVOPHED)  in premix infusion (0 mcg/min Intravenous Stopped 05/28/19 1130)  Ampicillin-Sulbactam (UNASYN) 3 g in sodium chloride 0.9 % 100 mL IVPB (has no administration in time range)  EPINEPHrine (ADRENALIN) 1 MG/10ML injection (1 mg Intravenous Given 05/28/19 0824)  lactated ringers bolus 1,000 mL (0 mLs Intravenous Stopped 05/28/19 0959)    And  lactated ringers bolus 1,000 mL (1,000 mLs Intravenous New Bag/Given 05/28/19 0958)  vancomycin (VANCOCIN) 1,250 mg in sodium chloride 0.9 % 250 mL IVPB (1,250 mg Intravenous New Bag/Given 05/28/19 1018)  naloxone (NARCAN) 2 MG/2ML injection (2 mg  Given 05/28/19 0915)  sodium chloride (PF) 0.9 % injection (  Given 05/28/19 1226)  iohexol (OMNIPAQUE) 350 MG/ML injection 100 mL (100 mLs Intravenous Contrast Given 05/28/19 1035)    Mobility non-ambulatory     Focused Assessments Neuro Assessment Handoff:  Swallow screen pass? No  Cardiac Rhythm: Sinus tachycardia       Neuro Assessment:   Neuro Checks:      Last Documented NIHSS Modified Score:   Has TPA been given? No If patient is a Neuro Trauma and patient is going to OR before floor call report to 4N Charge nurse: [phone] or  [phone]     R Recommendations: See Admitting Provider Note  Report given to:   Additional Notes: N/A

## 2019-05-28 NOTE — Progress Notes (Signed)
Patient transported to nuclear med and back without issue.

## 2019-05-28 NOTE — Code Documentation (Signed)
Pulse check, no pulse. Preparing for intubation

## 2019-05-28 NOTE — Code Documentation (Signed)
Compressions held for airway

## 2019-05-28 NOTE — Progress Notes (Signed)
eLink Physician-Brief Progress Note Patient Name: Paige Whitney DOB: 10/04/1965 MRN: [id number]   Date of Service  05/28/2019  HPI/Events of Note  CDS requests A-line and CVL placement.   eICU Interventions  Will order: 1. RT to place A-line. 2. Will pass request for CVL to PCCM ground team.      Intervention Category Major Interventions: Other:  Sommer,Steven Eugene 05/28/2019, 8:52 PM 

## 2019-05-28 NOTE — Progress Notes (Signed)
This nurse began referral process with CDS upon patient arrival to unit.  CDS referral number [id number] CDS stated patient is 1st person for tissue and eyes.

## 2019-05-28 NOTE — Procedures (Signed)
Central Venous Catheter Insertion Procedure Note Paige Whitney [id number] May 27, 1966  Procedure: Insertion of Central Venous Catheter Indications: Drug and/or fluid administration and Frequent blood sampling  Procedure Details Consent: Risks of procedure as well as the alternatives and risks of each were explained to the (patient/caregiver).  Consent for procedure obtained. Time Out: Verified patient identification, verified procedure, site/side was marked, verified correct patient position, special equipment/implants available, medications/allergies/relevent history reviewed, required imaging and test results available.  Performed  Maximum sterile technique was used including antiseptics, cap, gloves, gown, hand hygiene, mask and sheet. Skin prep: Chlorhexidine; local anesthetic administered A antimicrobial bonded/coated triple lumen catheter was placed in the right internal jugular vein using the Seldinger technique and verified with [place].  Evaluation Blood flow good Complications: No apparent complications Patient did tolerate procedure well. Chest X-ray ordered to verify placement.  CXR: pending.  Otilio Carpen Paige Whitney 05/28/2019, 10:30 PM

## 2019-05-28 NOTE — Progress Notes (Signed)
A consult was received from an ED physician for vancomycin & cefepime per pharmacy dosing.  The patient's profile has been reviewed for ht/wt/allergies/indication/available labs.   A one time order has been placed for vancomycin 1250 mg & cefepime 2 gm.    Further antibiotics/pharmacy consults should be ordered by admitting physician if indicated.                       Thank you,  Eudelia Bunch, Pharm.D [phone] 05/28/2019 8:57 AM

## 2019-05-28 NOTE — ED Notes (Signed)
Date and time results received: 05/28/19 10:22 AM  (use smartphrase ".now" to insert current time)  Test: Lactic  Critical Value: 6.9  Name of Provider Notified: [place]  Orders Received? Or Actions Taken?: Actions Taken: Chiropodist and EDP

## 2019-05-28 NOTE — Progress Notes (Signed)
RT disconnected patient from ventilator and placed patient on 8L via O2 tubing for 8 minutes beginning at 2328. RT obtained ABG at 2336 and placed patient back on ventilator.

## 2019-05-28 NOTE — Code Documentation (Signed)
Pulses present, Strong

## 2019-05-28 NOTE — H&P (View-Only) (Signed)
[facility] Progress Note Patient Name: Paige Whitney DOB: 1965/09/07 MRN: [id number]   Date of Service  05/28/2019  HPI/Events of Note  CDS requests A-line and CVL placement.   eICU Interventions  Will order: 1. RT to place A-line. 2. Will pass request for CVL to PCCM ground team.      Intervention Category Major Interventions: Other:  Lysle Dingwall 05/28/2019, 8:52 PM

## 2019-05-28 NOTE — Progress Notes (Signed)
PCCM Interval Note  Pressors being weaned down, norpei down to 3  CT angio head and neck still pending  I called and updated her husband (separated) Paige Whitney. He understands the plan. Also understands very guarded prognosis based on her current neuro exam.   Baltazar Apo, MD, PhD 05/28/2019, 11:27 AM [facility] [phone] or if no answer [phone]

## 2019-05-29 ENCOUNTER — Other Ambulatory Visit (HOSPITAL_COMMUNITY): Payer: Medicaid Other

## 2019-05-29 ENCOUNTER — Encounter (HOSPITAL_COMMUNITY)
Admission: EM | Disposition: A | Discharge status: Other Acute Inpatient Hospital | Payer: Self-pay | Source: Home / Self Care | Attending: Emergency Medicine

## 2019-05-29 DIAGNOSIS — Z5289 Donor of other specified organs or tissues: Secondary | ICD-10-CM | POA: Diagnosis not present

## 2019-05-29 HISTORY — PX: LEFT HEART CATH AND CORONARY ANGIOGRAPHY: CATH118249

## 2019-05-29 LAB — COMPREHENSIVE METABOLIC PANEL
ALT: 43 U/L (ref 0–44)
ALT: 39 U/L (ref 0–44)
AST: 48 U/L — ABNORMAL HIGH (ref 15–41)
AST: 34 U/L (ref 15–41)
Albumin: 3 g/dL — ABNORMAL LOW (ref 3.5–5.0)
Albumin: 2.6 g/dL — ABNORMAL LOW (ref 3.5–5.0)
Alkaline Phosphatase: 48 U/L (ref 38–126)
Alkaline Phosphatase: 38 U/L (ref 38–126)
Anion gap: 3 — ABNORMAL LOW (ref 5–15)
Anion gap: 7 (ref 5–15)
BUN: 18 mg/dL (ref 6–20)
BUN: 15 mg/dL (ref 6–20)
CO2: 21 mmol/L — ABNORMAL LOW (ref 22–32)
CO2: 21 mmol/L — ABNORMAL LOW (ref 22–32)
Calcium: 8.1 mg/dL — ABNORMAL LOW (ref 8.9–10.3)
Calcium: 8.6 mg/dL — ABNORMAL LOW (ref 8.9–10.3)
Chloride: 121 mmol/L — ABNORMAL HIGH (ref 98–111)
Chloride: 120 mmol/L — ABNORMAL HIGH (ref 98–111)
Creatinine, Ser: 1.08 mg/dL — ABNORMAL HIGH (ref 0.44–1.00)
Creatinine, Ser: 0.95 mg/dL (ref 0.44–1.00)
GFR calc Af Amer: >60 mL/min (ref >60)
GFR calc Af Amer: >60 mL/min (ref >60)
GFR calc non Af Amer: 59 mL/min — ABNORMAL LOW (ref >60)
GFR calc non Af Amer: >60 mL/min (ref >60)
Glucose, Bld: 102 mg/dL — ABNORMAL HIGH (ref 70–99)
Glucose, Bld: 133 mg/dL — ABNORMAL HIGH (ref 70–99)
Potassium: 3.7 mmol/L (ref 3.5–5.1)
Potassium: 3.9 mmol/L (ref 3.5–5.1)
Sodium: 145 mmol/L (ref 135–145)
Sodium: 148 mmol/L — ABNORMAL HIGH (ref 135–145)
Total Bilirubin: 0.7 mg/dL (ref 0.3–1.2)
Total Bilirubin: 0.4 mg/dL (ref 0.3–1.2)
Total Protein: 5.3 g/dL — ABNORMAL LOW (ref 6.5–8.1)
Total Protein: 4.9 g/dL — ABNORMAL LOW (ref 6.5–8.1)

## 2019-05-29 LAB — POCT I-STAT 7, (LYTES, BLD GAS, ICA,H+H)
Acid-base deficit: 2 mmol/L (ref 0.0–2.0)
Acid-base deficit: 3 mmol/L — ABNORMAL HIGH (ref 0.0–2.0)
Acid-base deficit: 3 mmol/L — ABNORMAL HIGH (ref 0.0–2.0)
Acid-base deficit: 3 mmol/L — ABNORMAL HIGH (ref 0.0–2.0)
Acid-base deficit: 4 mmol/L — ABNORMAL HIGH (ref 0.0–2.0)
Acid-base deficit: 4 mmol/L — ABNORMAL HIGH (ref 0.0–2.0)
Acid-base deficit: 5 mmol/L — ABNORMAL HIGH (ref 0.0–2.0)
Acid-base deficit: 6 mmol/L — ABNORMAL HIGH (ref 0.0–2.0)
Acid-base deficit: 6 mmol/L — ABNORMAL HIGH (ref 0.0–2.0)
Acid-base deficit: 6 mmol/L — ABNORMAL HIGH (ref 0.0–2.0)
Acid-base deficit: 7 mmol/L — ABNORMAL HIGH (ref 0.0–2.0)
Bicarbonate: 18.9 mmol/L — ABNORMAL LOW (ref 20.0–28.0)
Bicarbonate: 19.8 mmol/L — ABNORMAL LOW (ref 20.0–28.0)
Bicarbonate: 20.3 mmol/L (ref 20.0–28.0)
Bicarbonate: 21 mmol/L (ref 20.0–28.0)
Bicarbonate: 21.1 mmol/L (ref 20.0–28.0)
Bicarbonate: 21.3 mmol/L (ref 20.0–28.0)
Bicarbonate: 21.9 mmol/L (ref 20.0–28.0)
Bicarbonate: 22.4 mmol/L (ref 20.0–28.0)
Bicarbonate: 22.5 mmol/L (ref 20.0–28.0)
Bicarbonate: 23 mmol/L (ref 20.0–28.0)
Bicarbonate: 23.7 mmol/L (ref 20.0–28.0)
Calcium, Ion: 1.24 mmol/L (ref 1.15–1.40)
Calcium, Ion: 1.27 mmol/L (ref 1.15–1.40)
Calcium, Ion: 1.28 mmol/L (ref 1.15–1.40)
Calcium, Ion: 1.29 mmol/L (ref 1.15–1.40)
Calcium, Ion: 1.31 mmol/L (ref 1.15–1.40)
Calcium, Ion: 1.31 mmol/L (ref 1.15–1.40)
Calcium, Ion: 1.32 mmol/L (ref 1.15–1.40)
Calcium, Ion: 1.32 mmol/L (ref 1.15–1.40)
Calcium, Ion: 1.34 mmol/L (ref 1.15–1.40)
Calcium, Ion: 1.35 mmol/L (ref 1.15–1.40)
Calcium, Ion: 1.35 mmol/L (ref 1.15–1.40)
HCT: 28 % — ABNORMAL LOW (ref 36.0–46.0)
HCT: 28 % — ABNORMAL LOW (ref 36.0–46.0)
HCT: 29 % — ABNORMAL LOW (ref 36.0–46.0)
HCT: 30 % — ABNORMAL LOW (ref 36.0–46.0)
HCT: 30 % — ABNORMAL LOW (ref 36.0–46.0)
HCT: 30 % — ABNORMAL LOW (ref 36.0–46.0)
HCT: 31 % — ABNORMAL LOW (ref 36.0–46.0)
HCT: 31 % — ABNORMAL LOW (ref 36.0–46.0)
HCT: 36 % (ref 36.0–46.0)
HCT: 36 % (ref 36.0–46.0)
HCT: 37 % (ref 36.0–46.0)
Hemoglobin: 10.2 g/dL — ABNORMAL LOW (ref 12.0–15.0)
Hemoglobin: 10.2 g/dL — ABNORMAL LOW (ref 12.0–15.0)
Hemoglobin: 10.2 g/dL — ABNORMAL LOW (ref 12.0–15.0)
Hemoglobin: 10.5 g/dL — ABNORMAL LOW (ref 12.0–15.0)
Hemoglobin: 10.5 g/dL — ABNORMAL LOW (ref 12.0–15.0)
Hemoglobin: 12.2 g/dL (ref 12.0–15.0)
Hemoglobin: 12.2 g/dL (ref 12.0–15.0)
Hemoglobin: 12.6 g/dL (ref 12.0–15.0)
Hemoglobin: 9.5 g/dL — ABNORMAL LOW (ref 12.0–15.0)
Hemoglobin: 9.5 g/dL — ABNORMAL LOW (ref 12.0–15.0)
Hemoglobin: 9.9 g/dL — ABNORMAL LOW (ref 12.0–15.0)
O2 Saturation: 100 %
O2 Saturation: 100 %
O2 Saturation: 100 %
O2 Saturation: 98 %
O2 Saturation: 99 %
O2 Saturation: 99 %
O2 Saturation: 99 %
O2 Saturation: 99 %
O2 Saturation: 99 %
O2 Saturation: 99 %
O2 Saturation: 99 %
Patient temperature: 100.5
Patient temperature: 101.3
Patient temperature: 36
Patient temperature: 36.4
Patient temperature: 37
Patient temperature: 37.1
Patient temperature: 37.1
Patient temperature: 37.2
Patient temperature: 37.2
Patient temperature: 38
Patient temperature: 96.8
Potassium: 3.5 mmol/L (ref 3.5–5.1)
Potassium: 3.6 mmol/L (ref 3.5–5.1)
Potassium: 3.6 mmol/L (ref 3.5–5.1)
Potassium: 3.6 mmol/L (ref 3.5–5.1)
Potassium: 3.8 mmol/L (ref 3.5–5.1)
Potassium: 3.8 mmol/L (ref 3.5–5.1)
Potassium: 3.9 mmol/L (ref 3.5–5.1)
Potassium: 3.9 mmol/L (ref 3.5–5.1)
Potassium: 3.9 mmol/L (ref 3.5–5.1)
Potassium: 3.9 mmol/L (ref 3.5–5.1)
Potassium: 3.9 mmol/L (ref 3.5–5.1)
Sodium: 148 mmol/L — ABNORMAL HIGH (ref 135–145)
Sodium: 148 mmol/L — ABNORMAL HIGH (ref 135–145)
Sodium: 149 mmol/L — ABNORMAL HIGH (ref 135–145)
Sodium: 149 mmol/L — ABNORMAL HIGH (ref 135–145)
Sodium: 149 mmol/L — ABNORMAL HIGH (ref 135–145)
Sodium: 149 mmol/L — ABNORMAL HIGH (ref 135–145)
Sodium: 149 mmol/L — ABNORMAL HIGH (ref 135–145)
Sodium: 149 mmol/L — ABNORMAL HIGH (ref 135–145)
Sodium: 149 mmol/L — ABNORMAL HIGH (ref 135–145)
Sodium: 149 mmol/L — ABNORMAL HIGH (ref 135–145)
Sodium: 151 mmol/L — ABNORMAL HIGH (ref 135–145)
TCO2: 20 mmol/L — ABNORMAL LOW (ref 22–32)
TCO2: 21 mmol/L — ABNORMAL LOW (ref 22–32)
TCO2: 21 mmol/L — ABNORMAL LOW (ref 22–32)
TCO2: 22 mmol/L (ref 22–32)
TCO2: 22 mmol/L (ref 22–32)
TCO2: 22 mmol/L (ref 22–32)
TCO2: 23 mmol/L (ref 22–32)
TCO2: 24 mmol/L (ref 22–32)
TCO2: 24 mmol/L (ref 22–32)
TCO2: 24 mmol/L (ref 22–32)
TCO2: 25 mmol/L (ref 22–32)
pCO2 arterial: 36.4 mmHg (ref 32.0–48.0)
pCO2 arterial: 36.8 mmHg (ref 32.0–48.0)
pCO2 arterial: 37.5 mmHg (ref 32.0–48.0)
pCO2 arterial: 37.9 mmHg (ref 32.0–48.0)
pCO2 arterial: 39.6 mmHg (ref 32.0–48.0)
pCO2 arterial: 40.3 mmHg (ref 32.0–48.0)
pCO2 arterial: 41.9 mmHg (ref 32.0–48.0)
pCO2 arterial: 42.8 mmHg (ref 32.0–48.0)
pCO2 arterial: 44.1 mmHg (ref 32.0–48.0)
pCO2 arterial: 46.2 mmHg (ref 32.0–48.0)
pCO2 arterial: 66.2 mmHg (ref 32.0–48.0)
pH, Arterial: 7.143 — CL (ref 7.350–7.450)
pH, Arterial: 7.27 — ABNORMAL LOW (ref 7.350–7.450)
pH, Arterial: 7.328 — ABNORMAL LOW (ref 7.350–7.450)
pH, Arterial: 7.33 — ABNORMAL LOW (ref 7.350–7.450)
pH, Arterial: 7.335 — ABNORMAL LOW (ref 7.350–7.450)
pH, Arterial: 7.337 — ABNORMAL LOW (ref 7.350–7.450)
pH, Arterial: 7.338 — ABNORMAL LOW (ref 7.350–7.450)
pH, Arterial: 7.339 — ABNORMAL LOW (ref 7.350–7.450)
pH, Arterial: 7.34 — ABNORMAL LOW (ref 7.350–7.450)
pH, Arterial: 7.34 — ABNORMAL LOW (ref 7.350–7.450)
pH, Arterial: 7.365 (ref 7.350–7.450)
pO2, Arterial: 120 mmHg — ABNORMAL HIGH (ref 83.0–108.0)
pO2, Arterial: 122 mmHg — ABNORMAL HIGH (ref 83.0–108.0)
pO2, Arterial: 129 mmHg — ABNORMAL HIGH (ref 83.0–108.0)
pO2, Arterial: 143 mmHg — ABNORMAL HIGH (ref 83.0–108.0)
pO2, Arterial: 149 mmHg — ABNORMAL HIGH (ref 83.0–108.0)
pO2, Arterial: 152 mmHg — ABNORMAL HIGH (ref 83.0–108.0)
pO2, Arterial: 166 mmHg — ABNORMAL HIGH (ref 83.0–108.0)
pO2, Arterial: 169 mmHg — ABNORMAL HIGH (ref 83.0–108.0)
pO2, Arterial: 377 mmHg — ABNORMAL HIGH (ref 83.0–108.0)
pO2, Arterial: 409 mmHg — ABNORMAL HIGH (ref 83.0–108.0)
pO2, Arterial: 446 mmHg — ABNORMAL HIGH (ref 83.0–108.0)

## 2019-05-29 LAB — DIFFERENTIAL
Abs Immature Granulocytes: 0.08 10*3/uL — ABNORMAL HIGH (ref 0.00–0.07)
Basophils Absolute: 0.1 10*3/uL (ref 0.0–0.1)
Basophils Relative: 1 %
Eosinophils Absolute: 0.1 10*3/uL (ref 0.0–0.5)
Eosinophils Relative: 1 %
Immature Granulocytes: 1 %
Lymphocytes Relative: 23 %
Lymphs Abs: 3.3 10*3/uL (ref 0.7–4.0)
Monocytes Absolute: 1.4 10*3/uL — ABNORMAL HIGH (ref 0.1–1.0)
Monocytes Relative: 10 %
Neutro Abs: 9.6 10*3/uL — ABNORMAL HIGH (ref 1.7–7.7)
Neutrophils Relative %: 64 %

## 2019-05-29 LAB — CBC WITH DIFFERENTIAL/PLATELET
Abs Immature Granulocytes: 0.07 10*3/uL (ref 0.00–0.07)
Basophils Absolute: 0 10*3/uL (ref 0.0–0.1)
Basophils Relative: 0 %
Eosinophils Absolute: 0 10*3/uL (ref 0.0–0.5)
Eosinophils Relative: 0 %
HCT: 31.8 % — ABNORMAL LOW (ref 36.0–46.0)
Hemoglobin: 10 g/dL — ABNORMAL LOW (ref 12.0–15.0)
Immature Granulocytes: 1 %
Lymphocytes Relative: 5 %
Lymphs Abs: 0.6 10*3/uL — ABNORMAL LOW (ref 0.7–4.0)
MCH: 28.7 pg (ref 26.0–34.0)
MCHC: 31.4 g/dL (ref 30.0–36.0)
MCV: 91.4 fL (ref 80.0–100.0)
Monocytes Absolute: 0.9 10*3/uL (ref 0.1–1.0)
Monocytes Relative: 6 %
Neutro Abs: 12.1 10*3/uL — ABNORMAL HIGH (ref 1.7–7.7)
Neutrophils Relative %: 88 %
Platelets: 122 10*3/uL — ABNORMAL LOW (ref 150–400)
RBC: 3.48 MIL/uL — ABNORMAL LOW (ref 3.87–5.11)
RDW: 18.5 % — ABNORMAL HIGH (ref 11.5–15.5)
WBC: 13.6 10*3/uL — ABNORMAL HIGH (ref 4.0–10.5)
nRBC: 0 % (ref 0.0–0.2)

## 2019-05-29 LAB — URINALYSIS, COMPLETE (UACMP) WITH MICROSCOPIC
Bacteria, UA: NONE SEEN
Bilirubin Urine: NEGATIVE
Glucose, UA: NEGATIVE mg/dL
Ketones, ur: NEGATIVE mg/dL
Leukocytes,Ua: NEGATIVE
Nitrite: NEGATIVE
Protein, ur: 100 mg/dL — AB
Specific Gravity, Urine: 1.034 — ABNORMAL HIGH (ref 1.005–1.030)
pH: 5 (ref 5.0–8.0)

## 2019-05-29 LAB — URINE CULTURE: Culture: NO GROWTH

## 2019-05-29 LAB — CK TOTAL AND CKMB (NOT AT ARMC)
CK, MB: 34.8 ng/mL — ABNORMAL HIGH (ref 0.5–5.0)
CK, MB: 15.7 ng/mL — ABNORMAL HIGH (ref 0.5–5.0)
Relative Index: 11.9 — ABNORMAL HIGH (ref 0.0–2.5)
Relative Index: 9 — ABNORMAL HIGH (ref 0.0–2.5)
Total CK: 293 U/L — ABNORMAL HIGH (ref 38–234)
Total CK: 175 U/L (ref 38–234)

## 2019-05-29 LAB — PHOSPHORUS: Phosphorus: 4.1 mg/dL (ref 2.5–4.6)

## 2019-05-29 LAB — ABO/RH
ABO/RH(D): A POS
ABO/RH(D): A POS
PT AG Type: POSITIVE

## 2019-05-29 LAB — BASIC METABOLIC PANEL
Anion gap: 6 (ref 5–15)
BUN: 18 mg/dL (ref 6–20)
CO2: 20 mmol/L — ABNORMAL LOW (ref 22–32)
Calcium: 8.1 mg/dL — ABNORMAL LOW (ref 8.9–10.3)
Chloride: 120 mmol/L — ABNORMAL HIGH (ref 98–111)
Creatinine, Ser: 1.04 mg/dL — ABNORMAL HIGH (ref 0.44–1.00)
GFR calc Af Amer: >60 mL/min (ref >60)
GFR calc non Af Amer: >60 mL/min (ref >60)
Glucose, Bld: 144 mg/dL — ABNORMAL HIGH (ref 70–99)
Potassium: 3.6 mmol/L (ref 3.5–5.1)
Sodium: 146 mmol/L — ABNORMAL HIGH (ref 135–145)

## 2019-05-29 LAB — TROPONIN I (HIGH SENSITIVITY)
Troponin I (High Sensitivity): 4723 ng/L (ref <18)
Troponin I (High Sensitivity): 1211 ng/L (ref <18)

## 2019-05-29 LAB — CBC
HCT: 37.2 % (ref 36.0–46.0)
HCT: 35.1 % — ABNORMAL LOW (ref 36.0–46.0)
Hemoglobin: 11.9 g/dL — ABNORMAL LOW (ref 12.0–15.0)
Hemoglobin: 11.1 g/dL — ABNORMAL LOW (ref 12.0–15.0)
MCH: 28.6 pg (ref 26.0–34.0)
MCH: 28.4 pg (ref 26.0–34.0)
MCHC: 32 g/dL (ref 30.0–36.0)
MCHC: 31.6 g/dL (ref 30.0–36.0)
MCV: 89.4 fL (ref 80.0–100.0)
MCV: 89.8 fL (ref 80.0–100.0)
Platelets: 231 10*3/uL (ref 150–400)
Platelets: 191 10*3/uL (ref 150–400)
RBC: 4.16 MIL/uL (ref 3.87–5.11)
RBC: 3.91 MIL/uL (ref 3.87–5.11)
RDW: 17.7 % — ABNORMAL HIGH (ref 11.5–15.5)
RDW: 17.9 % — ABNORMAL HIGH (ref 11.5–15.5)
WBC: 14.5 10*3/uL — ABNORMAL HIGH (ref 4.0–10.5)
WBC: 12.5 10*3/uL — ABNORMAL HIGH (ref 4.0–10.5)
nRBC: 0 % (ref 0.0–0.2)
nRBC: 0 % (ref 0.0–0.2)

## 2019-05-29 LAB — BILIRUBIN, DIRECT
Bilirubin, Direct: 0.1 mg/dL (ref 0.0–0.2)
Bilirubin, Direct: <0.1 mg/dL (ref 0.0–0.2)

## 2019-05-29 LAB — APTT: aPTT: 32 seconds (ref 24–36)

## 2019-05-29 LAB — FIBRINOGEN: Fibrinogen: 248 mg/dL (ref 210–475)

## 2019-05-29 LAB — HEMOGLOBIN A1C
Hgb A1c MFr Bld: 6 % — ABNORMAL HIGH (ref 4.8–5.6)
Mean Plasma Glucose: 125.5 mg/dL

## 2019-05-29 LAB — URINALYSIS, ROUTINE W REFLEX MICROSCOPIC
Bilirubin Urine: NEGATIVE
Glucose, UA: NEGATIVE mg/dL
Hgb urine dipstick: NEGATIVE
Ketones, ur: NEGATIVE mg/dL
Leukocytes,Ua: NEGATIVE
Nitrite: NEGATIVE
Protein, ur: NEGATIVE mg/dL
Specific Gravity, Urine: 1.043 — ABNORMAL HIGH (ref 1.005–1.030)
pH: 5 (ref 5.0–8.0)

## 2019-05-29 LAB — PROTIME-INR
INR: 1.2 (ref 0.8–1.2)
INR: 1.2 (ref 0.8–1.2)
Prothrombin Time: 15.5 seconds — ABNORMAL HIGH (ref 11.4–15.2)
Prothrombin Time: 14.7 seconds (ref 11.4–15.2)

## 2019-05-29 LAB — MAGNESIUM
Magnesium: 2 mg/dL (ref 1.7–2.4)
Magnesium: 1.8 mg/dL (ref 1.7–2.4)

## 2019-05-29 LAB — GAMMA GT: GGT: 18 U/L (ref 7–50)

## 2019-05-29 LAB — AMYLASE
Amylase: 44 U/L (ref 28–100)
Amylase: 58 U/L (ref 28–100)

## 2019-05-29 LAB — ECHOCARDIOGRAM COMPLETE
Height: 68 in
Weight: 1985.9 oz

## 2019-05-29 LAB — LIPASE, BLOOD
Lipase: 26 U/L (ref 11–51)
Lipase: 22 U/L (ref 11–51)

## 2019-05-29 LAB — LACTATE DEHYDROGENASE: LDH: 273 U/L — ABNORMAL HIGH (ref 98–192)

## 2019-05-29 SURGERY — LEFT HEART CATH AND CORONARY ANGIOGRAPHY
Anesthesia: LOCAL

## 2019-05-29 MED ORDER — IOHEXOL 350 MG/ML SOLN
INTRAVENOUS | Status: DC | PRN
  Administered 2019-05-29: 30 mL via INTRA_ARTERIAL

## 2019-05-29 MED ORDER — HEPARIN (PORCINE) IN NACL 1000-0.9 UT/500ML-% IV SOLN
INTRAVENOUS | Status: AC
  Filled 2019-05-29: qty 500

## 2019-05-29 MED ORDER — PIPERACILLIN-TAZOBACTAM 3.375 G IVPB
3.3750 g | Freq: Three times a day (TID) | INTRAVENOUS | Status: DC
  Administered 2019-05-29 – 2019-05-30 (×3): 3.375 g via INTRAVENOUS
  Filled 2019-05-29 (×2): qty 50

## 2019-05-29 MED ORDER — HYDRALAZINE HCL 20 MG/ML IJ SOLN: 10.0000 mg | INTRAMUSCULAR | Status: AC | PRN

## 2019-05-29 MED ORDER — LABETALOL HCL 5 MG/ML IV SOLN: 10.0000 mg | INTRAVENOUS | Status: AC | PRN

## 2019-05-29 MED ORDER — ONDANSETRON HCL 4 MG/2ML IJ SOLN: 4.0000 mg | Freq: Four times a day (QID) | INTRAMUSCULAR | Status: DC | PRN

## 2019-05-29 MED ORDER — SODIUM CHLORIDE 0.9% FLUSH
3.0000 mL | Freq: Two times a day (BID) | INTRAVENOUS | Status: DC
  Administered 2019-05-30: 3 mL via INTRAVENOUS

## 2019-05-29 MED ORDER — PIPERACILLIN-TAZOBACTAM 3.375 G IVPB 30 MIN: 3.3750 g | Freq: Three times a day (TID) | INTRAVENOUS | Status: DC

## 2019-05-29 MED ORDER — SODIUM CHLORIDE 0.9% FLUSH: 3.0000 mL | INTRAVENOUS | Status: DC | PRN

## 2019-05-29 MED ORDER — HEPARIN (PORCINE) IN NACL 1000-0.9 UT/500ML-% IV SOLN
INTRAVENOUS | Status: DC | PRN
  Administered 2019-05-29 (×3): 500 mL

## 2019-05-29 MED ORDER — SODIUM CHLORIDE 0.9 % IV SOLN: INTRAVENOUS | Status: AC

## 2019-05-29 MED ORDER — ALBUMIN HUMAN 5 % IV SOLN
12.5000 g | Freq: Once | INTRAVENOUS | Status: AC
  Administered 2019-05-29: 12.5 g via INTRAVENOUS
  Filled 2019-05-29: qty 250

## 2019-05-29 MED ORDER — ACETAMINOPHEN 325 MG PO TABS: 650.0000 mg | ORAL_TABLET | ORAL | Status: DC | PRN

## 2019-05-29 MED ORDER — LIDOCAINE HCL (PF) 1 % IJ SOLN
INTRAMUSCULAR | Status: AC
  Filled 2019-05-29: qty 30

## 2019-05-29 MED ORDER — LIDOCAINE HCL (PF) 1 % IJ SOLN
INTRAMUSCULAR | Status: DC | PRN
  Administered 2019-05-29: 15 mL

## 2019-05-29 MED ORDER — HEPARIN (PORCINE) IN NACL 1000-0.9 UT/500ML-% IV SOLN
INTRAVENOUS | Status: AC
  Filled 2019-05-29: qty 1000

## 2019-05-29 MED ORDER — SODIUM CHLORIDE 0.9 % IV SOLN: 250.0000 mL | INTRAVENOUS | Status: DC | PRN

## 2019-05-29 MED ORDER — VANCOMYCIN HCL IN DEXTROSE 1-5 GM/200ML-% IV SOLN
1000.0000 mg | Freq: Two times a day (BID) | INTRAVENOUS | Status: DC
  Administered 2019-05-29 – 2019-05-30 (×2): 1000 mg via INTRAVENOUS
  Filled 2019-05-29 (×2): qty 200

## 2019-05-29 SURGICAL SUPPLY — 10 items
CATH INFINITI 5FR ANG PIGTAIL (CATHETERS) ×2 IMPLANT
CATH INFINITI 5FR JL4 (CATHETERS) ×2 IMPLANT
CATH INFINITI JR4 5F (CATHETERS) ×2 IMPLANT
CLOSURE MYNX CONTROL 5F (Vascular Products) ×2 IMPLANT
KIT HEART LEFT (KITS) ×2 IMPLANT
PACK CARDIAC CATHETERIZATION (CUSTOM PROCEDURE TRAY) ×2 IMPLANT
SHEATH PINNACLE 5F 10CM (SHEATH) ×2 IMPLANT
TRANSDUCER W/STOPCOCK (MISCELLANEOUS) ×2 IMPLANT
TUBING CIL FLEX 10 FLL-RA (TUBING) ×2 IMPLANT
WIRE EMERALD 3MM-J .035X150CM (WIRE) ×2 IMPLANT

## 2019-05-29 NOTE — Progress Notes (Signed)
Patient transported from 4N22 to CT and back with no complications noted. 

## 2019-05-29 NOTE — Interval H&P Note (Signed)
Cath Lab Visit (complete for each Cath Lab visit)  Clinical Evaluation Leading to the Procedure:   ACS: No.  Non-ACS:    Anginal Classification: No Symptoms  Anti-ischemic medical therapy: No Therapy  Non-Invasive Test Results: No non-invasive testing performed  Prior CABG: No previous CABG      History and Physical Interval Note:  2019/06/23 4:42 PM  Paige Whitney  has presented today for surgery, with the diagnosis of STEMI.  The various methods of treatment have been discussed with the patient and family. After consideration of risks, benefits and other options for treatment, the patient has consented to  Procedure(s): LEFT HEART CATH AND CORONARY ANGIOGRAPHY (N/A) as a surgical intervention.  The patient's history has been reviewed, patient examined, no change in status, stable for surgery.  I have reviewed the patient's chart and labs.  Questions were answered to the patient's satisfaction.     Quay Burow

## 2019-05-29 NOTE — Progress Notes (Signed)
CDS asked night shift to place on 100% and then get an ABG. Just placed on 100% at 0759.

## 2019-05-29 NOTE — Procedures (Signed)
Bronchoscopy Procedure Note Paige Whitney [id number] 1966/04/20  Procedure: Bronchoscopy Indications: Diagnostic evaluation of the airways and Obtain specimens for culture and/or other diagnostic studies  Procedure Details Consent: Diagnostic for [facility] Time Out: Verified patient identification, verified procedure, site/side was marked, verified correct patient position, special equipment/implants available, medications/allergies/relevent history reviewed, required imaging and test results available.  Performed  In preparation for procedure, patient was given 100% FiO2 and bronchoscope lubricated. Sedation: none  Airway entered and the following bronchi were examined: RUL, RML, RLL, LUL and LLL.  Mucosa with mild erythema. Right greater than left side minimal thick, blood tinged secretions. Procedures performed: bronchial washing Bronchoscope removed.    Evaluation Hemodynamic Status: Transient hypotension treated with pressors; O2 sats: stable throughout Patient's Current Condition: stable Specimens:  Sent serosanguinous fluid Complications: No apparent complications Patient did tolerate procedure well.   Jacques Earthly 2019/06/03

## 2019-05-29 NOTE — Progress Notes (Signed)
Patient transported to cath lab and back to room 1E56 without complications.

## 2019-05-29 NOTE — Progress Notes (Signed)
RT obtained ABG and then did lung recruitment on patient per CDS. Patient's vitals remained stable throughout.

## 2019-05-29 NOTE — Progress Notes (Signed)
  Echocardiogram 2D Echocardiogram has been performed.  Paige Whitney June 10, 2019, 9:30 AM

## 2019-05-29 NOTE — Progress Notes (Signed)
abg obtained on 100%. Results given to CDS. Placed patient back on 40% after drawn

## 2019-05-29 NOTE — Progress Notes (Signed)
Recruitment done per CDS request. Left on 100%, ABG in 30 minuties

## 2019-05-30 ENCOUNTER — Encounter (HOSPITAL_COMMUNITY): Payer: Self-pay | Admitting: Cardiovascular Disease

## 2019-05-30 ENCOUNTER — Other Ambulatory Visit (HOSPITAL_COMMUNITY): Payer: Medicaid Other

## 2019-05-30 LAB — CBC WITH DIFFERENTIAL/PLATELET
Abs Immature Granulocytes: 0.07 10*3/uL (ref 0.00–0.07)
Basophils Absolute: 0 10*3/uL (ref 0.0–0.1)
Basophils Relative: 0 %
Eosinophils Absolute: 0 10*3/uL (ref 0.0–0.5)
Eosinophils Relative: 0 %
HCT: 29.8 % — ABNORMAL LOW (ref 36.0–46.0)
Hemoglobin: 9.3 g/dL — ABNORMAL LOW (ref 12.0–15.0)
Immature Granulocytes: 1 %
Lymphocytes Relative: 11 %
Lymphs Abs: 1.5 10*3/uL (ref 0.7–4.0)
MCH: 29 pg (ref 26.0–34.0)
MCHC: 31.2 g/dL (ref 30.0–36.0)
MCV: 92.8 fL (ref 80.0–100.0)
Monocytes Absolute: 0.9 10*3/uL (ref 0.1–1.0)
Monocytes Relative: 7 %
Neutro Abs: 11.3 10*3/uL — ABNORMAL HIGH (ref 1.7–7.7)
Neutrophils Relative %: 81 %
Platelets: 127 10*3/uL — ABNORMAL LOW (ref 150–400)
RBC: 3.21 MIL/uL — ABNORMAL LOW (ref 3.87–5.11)
RDW: 18.8 % — ABNORMAL HIGH (ref 11.5–15.5)
WBC: 13.7 10*3/uL — ABNORMAL HIGH (ref 4.0–10.5)
nRBC: 0 % (ref 0.0–0.2)

## 2019-05-30 LAB — COMPREHENSIVE METABOLIC PANEL
ALT: 33 U/L (ref 0–44)
AST: 23 U/L (ref 15–41)
Albumin: 2.6 g/dL — ABNORMAL LOW (ref 3.5–5.0)
Alkaline Phosphatase: 35 U/L — ABNORMAL LOW (ref 38–126)
Anion gap: 7 (ref 5–15)
BUN: 15 mg/dL (ref 6–20)
CO2: 22 mmol/L (ref 22–32)
Calcium: 8.8 mg/dL — ABNORMAL LOW (ref 8.9–10.3)
Chloride: 119 mmol/L — ABNORMAL HIGH (ref 98–111)
Creatinine, Ser: 1.09 mg/dL — ABNORMAL HIGH (ref 0.44–1.00)
GFR calc Af Amer: >60 mL/min (ref >60)
GFR calc non Af Amer: 58 mL/min — ABNORMAL LOW (ref >60)
Glucose, Bld: 125 mg/dL — ABNORMAL HIGH (ref 70–99)
Potassium: 3.9 mmol/L (ref 3.5–5.1)
Sodium: 148 mmol/L — ABNORMAL HIGH (ref 135–145)
Total Bilirubin: 0.4 mg/dL (ref 0.3–1.2)
Total Protein: 5.1 g/dL — ABNORMAL LOW (ref 6.5–8.1)

## 2019-05-30 LAB — POCT I-STAT 7, (LYTES, BLD GAS, ICA,H+H)
Acid-base deficit: 3 mmol/L — ABNORMAL HIGH (ref 0.0–2.0)
Acid-base deficit: 2 mmol/L (ref 0.0–2.0)
Acid-base deficit: 6 mmol/L — ABNORMAL HIGH (ref 0.0–2.0)
Bicarbonate: 21.9 mmol/L (ref 20.0–28.0)
Bicarbonate: 24 mmol/L (ref 20.0–28.0)
Bicarbonate: 25.5 mmol/L (ref 20.0–28.0)
Bicarbonate: 22.2 mmol/L (ref 20.0–28.0)
Calcium, Ion: 1.35 mmol/L (ref 1.15–1.40)
Calcium, Ion: 1.34 mmol/L (ref 1.15–1.40)
Calcium, Ion: 1.35 mmol/L (ref 1.15–1.40)
Calcium, Ion: 1.29 mmol/L (ref 1.15–1.40)
HCT: 26 % — ABNORMAL LOW (ref 36.0–46.0)
HCT: 26 % — ABNORMAL LOW (ref 36.0–46.0)
HCT: 24 % — ABNORMAL LOW (ref 36.0–46.0)
HCT: 36 % (ref 36.0–46.0)
Hemoglobin: 8.8 g/dL — ABNORMAL LOW (ref 12.0–15.0)
Hemoglobin: 8.8 g/dL — ABNORMAL LOW (ref 12.0–15.0)
Hemoglobin: 8.2 g/dL — ABNORMAL LOW (ref 12.0–15.0)
Hemoglobin: 12.2 g/dL (ref 12.0–15.0)
O2 Saturation: 98 %
O2 Saturation: 100 %
O2 Saturation: 100 %
O2 Saturation: 99 %
Patient temperature: 36.7
Patient temperature: 98.06
Patient temperature: 98.1
Patient temperature: 38.1
Potassium: 3.8 mmol/L (ref 3.5–5.1)
Potassium: 3.7 mmol/L (ref 3.5–5.1)
Potassium: 3.7 mmol/L (ref 3.5–5.1)
Potassium: 3.6 mmol/L (ref 3.5–5.1)
Sodium: 149 mmol/L — ABNORMAL HIGH (ref 135–145)
Sodium: 148 mmol/L — ABNORMAL HIGH (ref 135–145)
Sodium: 148 mmol/L — ABNORMAL HIGH (ref 135–145)
Sodium: 151 mmol/L — ABNORMAL HIGH (ref 135–145)
TCO2: 23 mmol/L (ref 22–32)
TCO2: 25 mmol/L (ref 22–32)
TCO2: 27 mmol/L (ref 22–32)
TCO2: 24 mmol/L (ref 22–32)
pCO2 arterial: 39.3 mmHg (ref 32.0–48.0)
pCO2 arterial: 43.1 mmHg (ref 32.0–48.0)
pCO2 arterial: 45.4 mmHg (ref 32.0–48.0)
pCO2 arterial: 59.2 mmHg — ABNORMAL HIGH (ref 32.0–48.0)
pH, Arterial: 7.352 (ref 7.350–7.450)
pH, Arterial: 7.353 (ref 7.350–7.450)
pH, Arterial: 7.356 (ref 7.350–7.450)
pH, Arterial: 7.187 — CL (ref 7.350–7.450)
pO2, Arterial: 114 mmHg — ABNORMAL HIGH (ref 83.0–108.0)
pO2, Arterial: 440 mmHg — ABNORMAL HIGH (ref 83.0–108.0)
pO2, Arterial: 416 mmHg — ABNORMAL HIGH (ref 83.0–108.0)
pO2, Arterial: 152 mmHg — ABNORMAL HIGH (ref 83.0–108.0)

## 2019-05-30 LAB — CK TOTAL AND CKMB (NOT AT ARMC)
CK, MB: 7.6 ng/mL — ABNORMAL HIGH (ref 0.5–5.0)
Relative Index: INVALID (ref 0.0–2.5)
Total CK: 99 U/L (ref 38–234)

## 2019-05-30 LAB — URINALYSIS, ROUTINE W REFLEX MICROSCOPIC
Bilirubin Urine: NEGATIVE
Glucose, UA: NEGATIVE mg/dL
Hgb urine dipstick: NEGATIVE
Ketones, ur: NEGATIVE mg/dL
Leukocytes,Ua: NEGATIVE
Nitrite: NEGATIVE
Protein, ur: NEGATIVE mg/dL
Specific Gravity, Urine: 1.045 — ABNORMAL HIGH (ref 1.005–1.030)
pH: 5 (ref 5.0–8.0)

## 2019-05-30 LAB — URINE CULTURE: Culture: NO GROWTH

## 2019-05-30 LAB — AMYLASE: Amylase: 90 U/L (ref 28–100)

## 2019-05-30 LAB — APTT: aPTT: 30 seconds (ref 24–36)

## 2019-05-30 LAB — LIPASE, BLOOD: Lipase: 22 U/L (ref 11–51)

## 2019-05-30 LAB — PROTIME-INR
INR: 1.2 (ref 0.8–1.2)
Prothrombin Time: 15.5 seconds — ABNORMAL HIGH (ref 11.4–15.2)

## 2019-05-30 LAB — BILIRUBIN, DIRECT: Bilirubin, Direct: <0.1 mg/dL (ref 0.0–0.2)

## 2019-05-30 MED ORDER — LACTATED RINGERS IV BOLUS
1000.0000 mL | Freq: Once | INTRAVENOUS | Status: AC
  Administered 2019-05-30: 1000 mL via INTRAVENOUS

## 2019-05-30 NOTE — Progress Notes (Signed)
The chaplain responded to a page from the nurse for an end of life and donor.  The chaplain was present with the family and prayed for the patient and with the family.  Brion Aliment Chaplain Resident For questions concerning this note please contact me by pager [phone]

## 2019-05-30 NOTE — Plan of Care (Signed)
53 yo F declared dead following brain death testing.  -CDS has evaluated patient and per RN CDS is due to arrive this morning (12/7).   - PCCM will be available to support per CDS.    Eliseo Gum MSN, AGACNP-BC [facility] [phone] If no answer, [phone] 05/30/2019, 9:37 AM

## 2019-05-30 NOTE — Progress Notes (Signed)
Recruitment maneuver done on patient at 11:20 and placed on 100% per CDS. ABG ordered at noon on 100%.

## 2019-05-30 NOTE — Progress Notes (Signed)
O2 challenge beginning at 0435 to 0505. FIO2 was increased to 100% and ABG obtained by RT. Fio2 returned to 40%. RT will monitor as needed.

## 2019-05-31 LAB — CULTURE, RESPIRATORY W GRAM STAIN
Culture: NORMAL
Culture: NORMAL

## 2019-05-31 LAB — BPAM RBC
Blood Product Expiration Date: 202101062359
Blood Product Expiration Date: 202101062359
Blood Product Expiration Date: 202101082359
Unit Type and Rh: 6200
Unit Type and Rh: 6200
Unit Type and Rh: 6200

## 2019-05-31 LAB — TYPE AND SCREEN
ABO/RH(D): A POS
Antibody Screen: NEGATIVE
PT AG Type: POSITIVE
Unit division: 0
Unit division: 0
Unit division: 0

## 2019-06-02 LAB — CULTURE, BLOOD (ROUTINE X 2)
Culture: NO GROWTH
Culture: NO GROWTH
Special Requests: ADEQUATE
Special Requests: ADEQUATE

## 2019-06-03 LAB — CULTURE, BLOOD (ROUTINE X 2)
Culture: NO GROWTH
Culture: NO GROWTH
Special Requests: ADEQUATE
Special Requests: ADEQUATE

## 2019-06-24 DEATH — deceased
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
## 2021-08-05 IMAGING — DX DG CHEST 1V PORT
2 series · 2 of 2 positions shown · non-contrast
Comparison: Portable exam 5848 hours compared to 05/28/2019

CLINICAL DATA: Acute respiratory failure

EXAM:
PORTABLE CHEST 1 VIEW

[chest ap (1 of 2)]
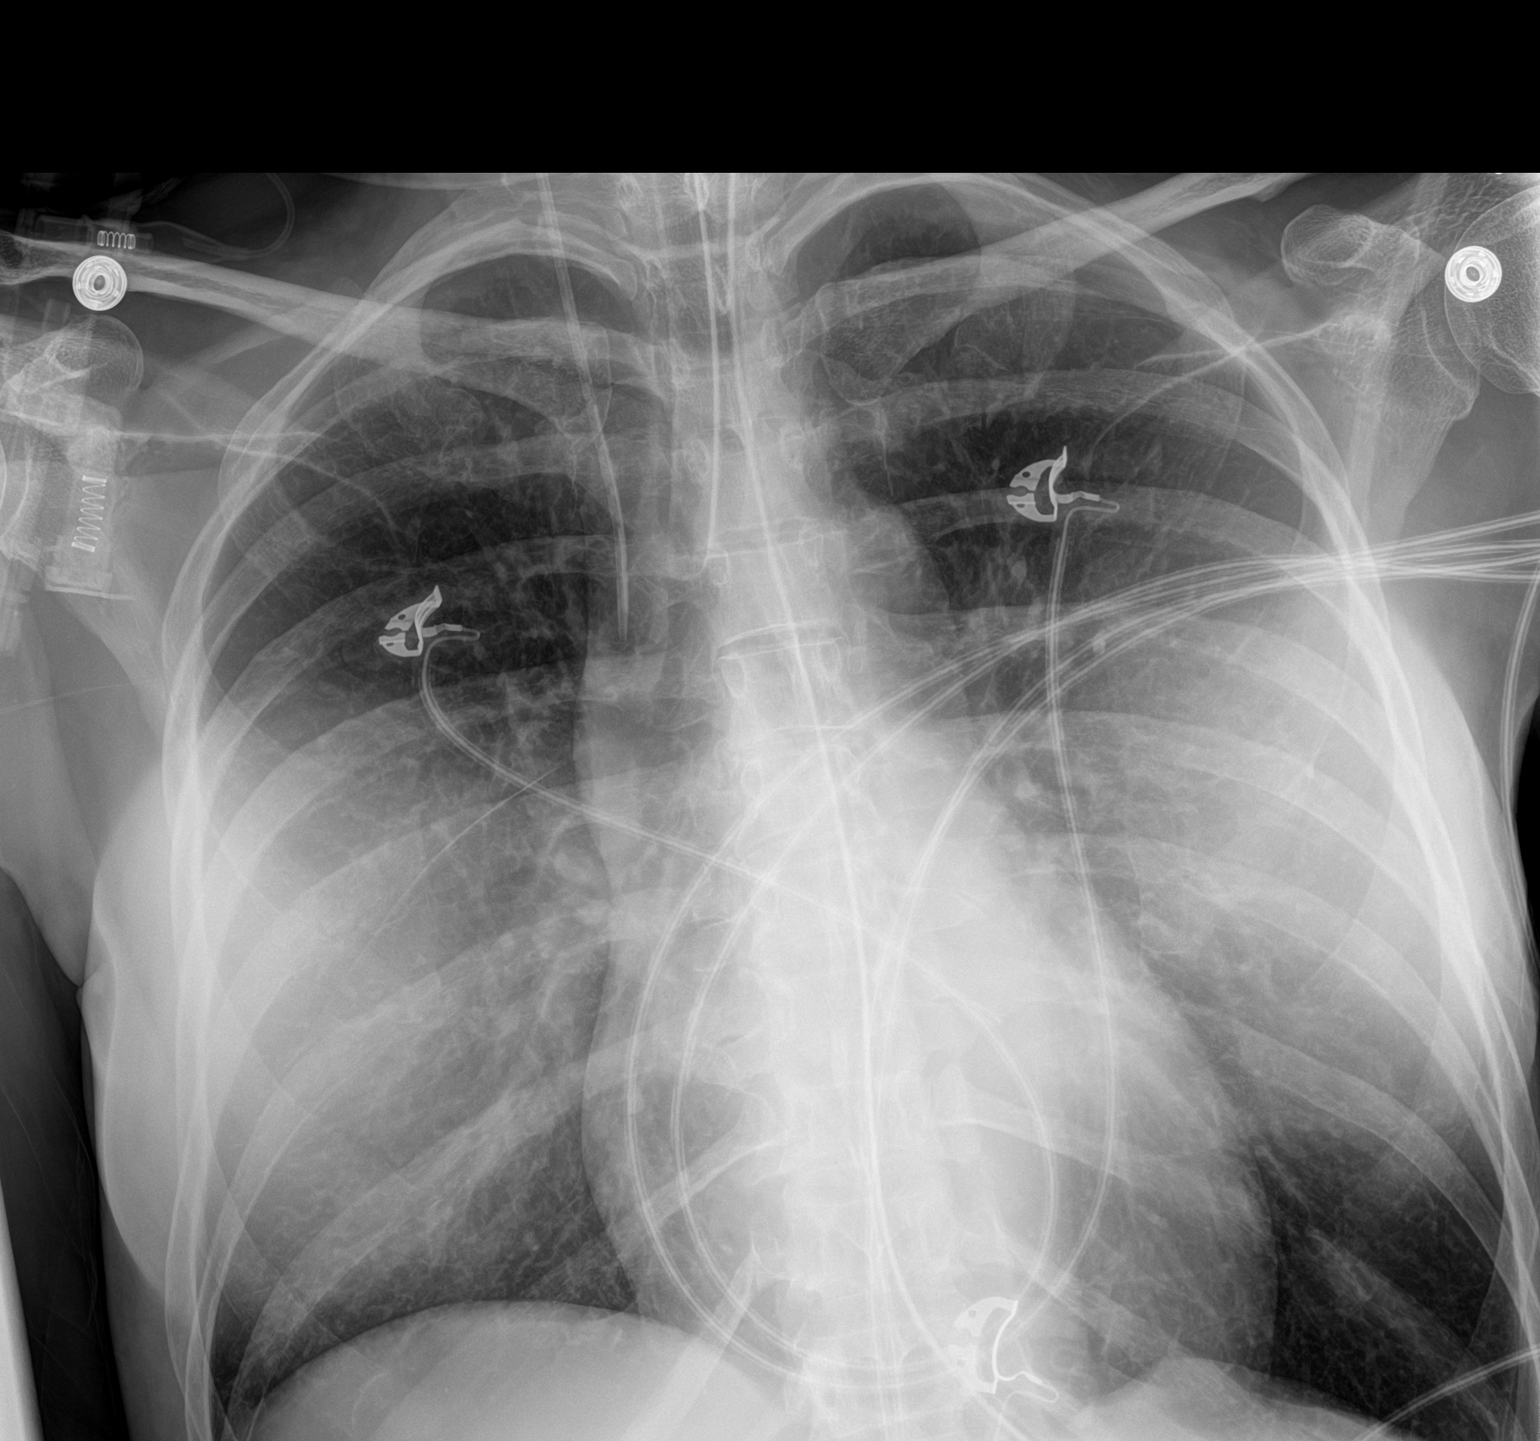

[chest ap (2 of 2)]
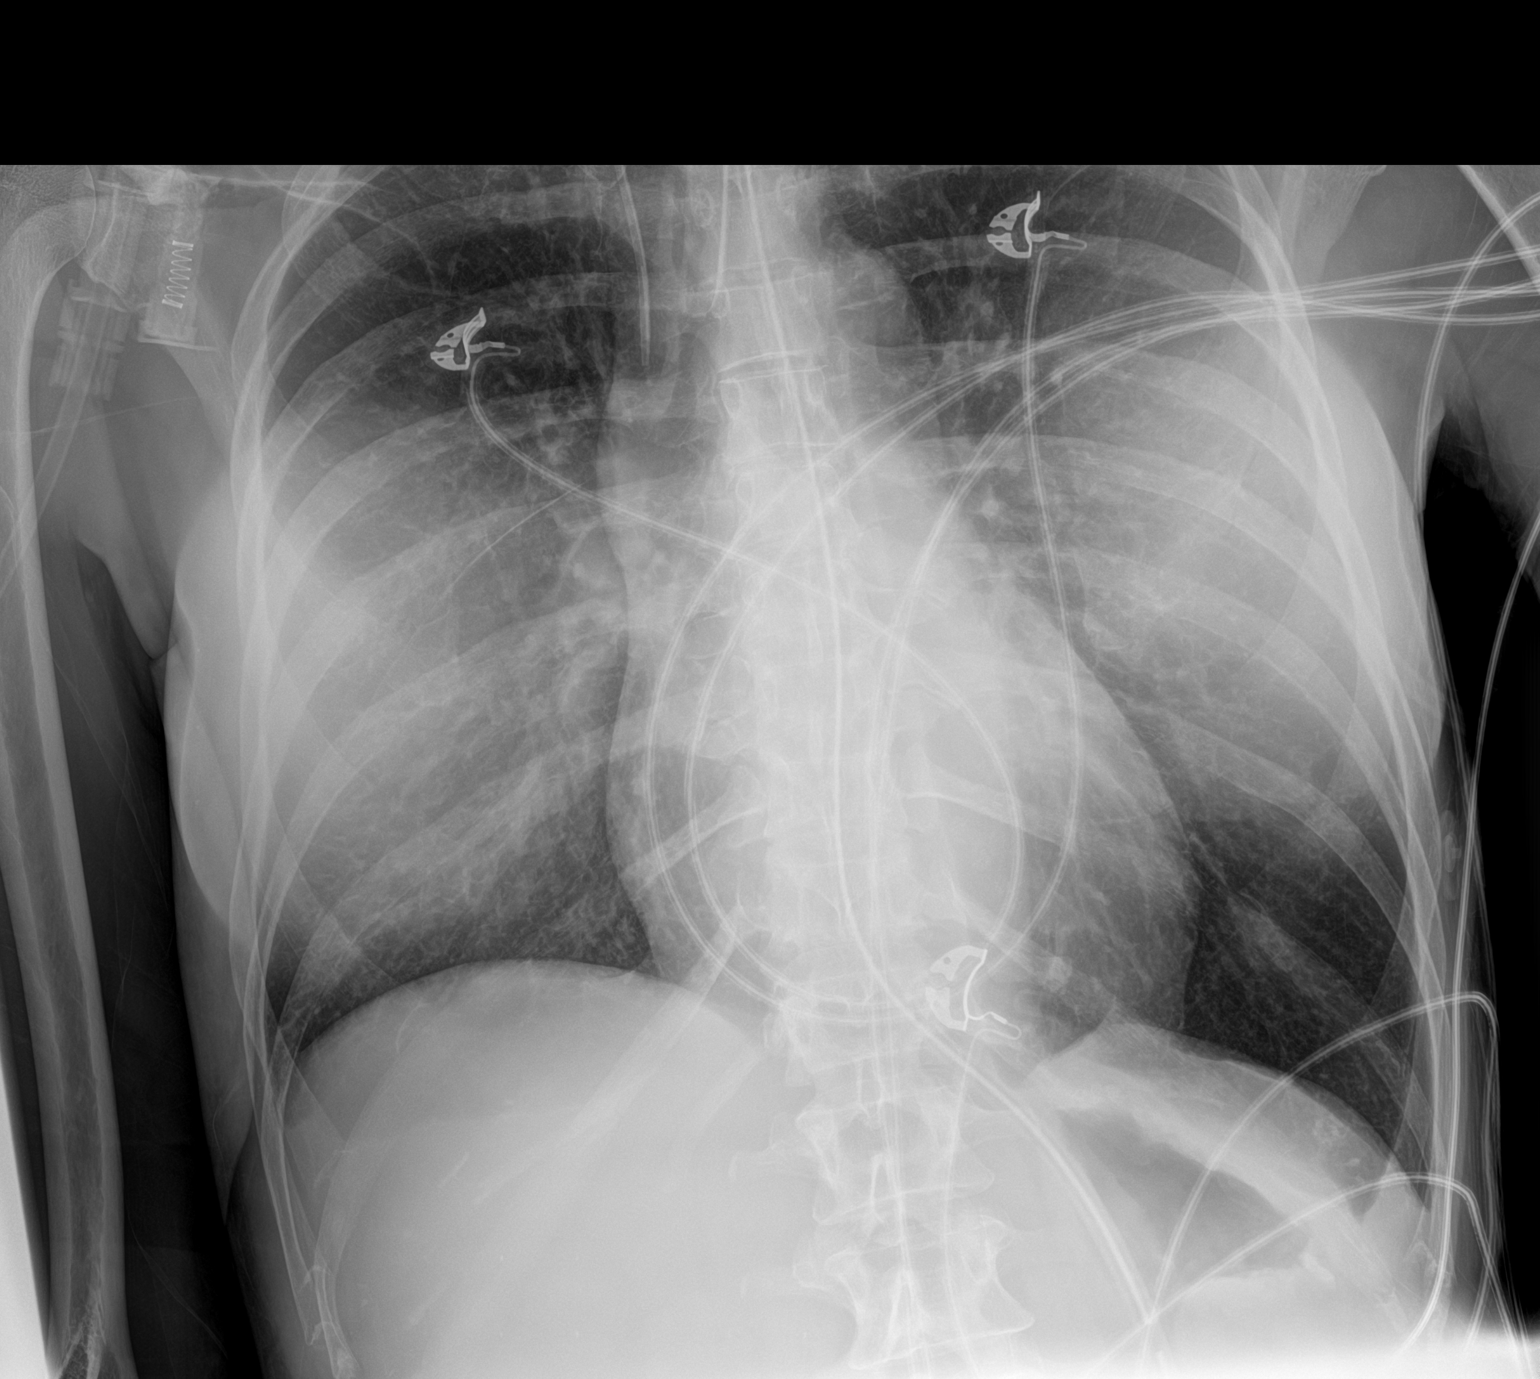

[2 of 2 positions shown; findings below may reference images not displayed]

FINDINGS: Tip of endotracheal tube projects 2.6 cm above carina.

Nasogastric tube extends into stomach.

RIGHT jugular central venous catheter tip projects over proximal
SVC.

Normal heart size, mediastinal contours, and pulmonary vascularity.

Lungs hyperinflated but clear.

No pleural effusion or pneumothorax.

Bones demineralized.

BILATERAL breast prostheses.
IMPRESSION: Hyperinflated lungs without infiltrate.
